# Patient Record
Sex: Female | Born: 1985 | Race: Black or African American | Hispanic: No | Marital: Single | Smoking: Former smoker
Health system: Southern US, Community
[De-identification: ages and names within clinical notes are randomized; demographics above are authoritative.]

## PROBLEM LIST (undated history)

## (undated) ENCOUNTER — Inpatient Hospital Stay (HOSPITAL_COMMUNITY): Payer: Self-pay

## (undated) DIAGNOSIS — J45909 Unspecified asthma, uncomplicated: Secondary | ICD-10-CM

## (undated) HISTORY — PX: NO PAST SURGERIES: SHX2092

## (undated) HISTORY — PX: WISDOM TOOTH EXTRACTION: SHX21

---
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
## 2008-01-07 ENCOUNTER — Other Ambulatory Visit: Admission: RE | Admit: 2008-01-07 | Discharge: 2008-01-07 | Payer: Self-pay | Admitting: Family Medicine

## 2010-04-19 ENCOUNTER — Inpatient Hospital Stay (INDEPENDENT_AMBULATORY_CARE_PROVIDER_SITE_OTHER)
Admission: RE | Admit: 2010-04-19 | Discharge: 2010-04-19 | Disposition: A | Discharge status: Home / Self Care | Payer: BC Managed Care – PPO | Source: Ambulatory Visit | Attending: Emergency Medicine | Admitting: Emergency Medicine

## 2010-04-19 DIAGNOSIS — S61409A Unspecified open wound of unspecified hand, initial encounter: Secondary | ICD-10-CM

## 2010-04-27 ENCOUNTER — Inpatient Hospital Stay (INDEPENDENT_AMBULATORY_CARE_PROVIDER_SITE_OTHER)
Admission: RE | Admit: 2010-04-27 | Discharge: 2010-04-27 | Disposition: A | Discharge status: Home / Self Care | Payer: Self-pay | Source: Ambulatory Visit | Attending: Family Medicine | Admitting: Family Medicine

## 2010-04-27 DIAGNOSIS — Z0489 Encounter for examination and observation for other specified reasons: Secondary | ICD-10-CM

## 2010-04-27 DIAGNOSIS — L732 Hidradenitis suppurativa: Secondary | ICD-10-CM

## 2010-12-22 ENCOUNTER — Emergency Department (HOSPITAL_COMMUNITY)
Admission: EM | Admit: 2010-12-22 | Discharge: 2010-12-22 | Disposition: A | Discharge status: Home / Self Care | Payer: BC Managed Care – PPO | Attending: Emergency Medicine | Admitting: Emergency Medicine

## 2010-12-22 ENCOUNTER — Encounter: Payer: Self-pay | Admitting: Emergency Medicine

## 2010-12-22 DIAGNOSIS — R112 Nausea with vomiting, unspecified: Secondary | ICD-10-CM | POA: Insufficient documentation

## 2010-12-22 LAB — COMPREHENSIVE METABOLIC PANEL
Alkaline Phosphatase: 97 U/L (ref 39–117)
BUN: 8 mg/dL (ref 6–23)
Calcium: 9.4 mg/dL (ref 8.4–10.5)
Creatinine, Ser: 0.99 mg/dL (ref 0.50–1.10)
GFR calc Af Amer: >90 mL/min (ref >90)
Glucose, Bld: 95 mg/dL (ref 70–99)
Potassium: 3.4 mEq/L — ABNORMAL LOW (ref 3.5–5.1)
Total Protein: 8.2 g/dL (ref 6.0–8.3)

## 2010-12-22 LAB — CBC
HCT: 41.8 % (ref 36.0–46.0)
Hemoglobin: 14.2 g/dL (ref 12.0–15.0)
MCH: 29.3 pg (ref 26.0–34.0)
MCV: 86.2 fL (ref 78.0–100.0)
RBC: 4.85 MIL/uL (ref 3.87–5.11)

## 2010-12-22 LAB — DIFFERENTIAL
Eosinophils Absolute: 0.1 10*3/uL (ref 0.0–0.7)
Eosinophils Relative: 1 % (ref 0–5)
Lymphs Abs: 2.3 10*3/uL (ref 0.7–4.0)
Monocytes Absolute: 0.9 10*3/uL (ref 0.1–1.0)
Monocytes Relative: 9 % (ref 3–12)

## 2010-12-22 LAB — URINALYSIS, ROUTINE W REFLEX MICROSCOPIC
Nitrite: NEGATIVE
pH: 6.5 (ref 5.0–8.0)

## 2010-12-22 LAB — URINE MICROSCOPIC-ADD ON

## 2010-12-22 MED ORDER — FAMOTIDINE IN NACL 20-0.9 MG/50ML-% IV SOLN
20.0000 mg | Freq: Once | INTRAVENOUS | Status: AC
  Administered 2010-12-22: 20 mg via INTRAVENOUS
  Filled 2010-12-22: qty 50

## 2010-12-22 MED ORDER — ONDANSETRON HCL 4 MG/2ML IJ SOLN
4.0000 mg | Freq: Once | INTRAMUSCULAR | Status: AC
  Administered 2010-12-22: 4 mg via INTRAVENOUS
  Filled 2010-12-22: qty 2

## 2010-12-22 MED ORDER — ONDANSETRON HCL 4 MG PO TABS: 4.0000 mg | ORAL_TABLET | Freq: Four times a day (QID) | ORAL | Status: AC

## 2010-12-22 MED ORDER — SODIUM CHLORIDE 0.9 % IV BOLUS (SEPSIS)
1000.0000 mL | Freq: Once | INTRAVENOUS | Status: AC
  Administered 2010-12-22: 1000 mL via INTRAVENOUS

## 2010-12-22 MED ORDER — OMEPRAZOLE 20 MG PO CPDR: 20.0000 mg | DELAYED_RELEASE_CAPSULE | Freq: Every day | ORAL | Status: DC

## 2010-12-22 NOTE — ED Notes (Signed)
Pt alert, nad, c/o nausea with emesis, inset a few days ago, resp even unlabored, skin bwd, denies changes in bowel or bladder habits, denies recent ill contacts or exposures

## 2010-12-22 NOTE — ED Notes (Signed)
Patient stable upon discharge. Patient discharged to care of friend

## 2010-12-22 NOTE — ED Provider Notes (Signed)
History     CSN: [id number] Arrival date & time: 12/22/2010  7:35 PM   First MD Initiated Contact with Patient 12/22/10 2105      Chief Complaint  Patient presents with  . Nausea  . Emesis    (Consider location/radiation/quality/duration/timing/severity/associated sxs/prior treatment) HPI Comments: Patient presents complaining of nausea for the last 3 days.  She denies any abdominal pain.  She has no fevers.  She is diarrhea.  States her last menstrual period was November 15 normal for her.  Denies any vaginal  discharge or dysuria symptoms. she can have nausea when attempting to eat or in between.  She has kept some food down but notes that within a few hours she will have a vomiting episode that is without any pain.  She has not had similar symptoms in the past.  She has no past abdominal surgeries.    Patient is a 25 y.o. female presenting with vomiting. The history is provided by the patient. No language interpreter was used.  Emesis  This is a new problem. The current episode started 2 days ago. The problem occurs 2 to 4 times per day. The problem has not changed since onset.The emesis has an appearance of stomach contents. There has been no fever. Pertinent negatives include no abdominal pain, no chills, no cough, no diarrhea, no fever and no headaches.    History reviewed. No pertinent past medical history.  History reviewed. No pertinent past surgical history.  No family history on file.  History  Substance Use Topics  . Smoking status: Passive Smoker -- 0.5 packs/day    Types: Cigarettes  . Smokeless tobacco: Not on file  . Alcohol Use: No    OB History    Grav Para Term Preterm Abortions TAB SAB Ect Mult Living                  Review of Systems  Constitutional: Negative.  Negative for fever and chills.  HENT: Negative.   Eyes: Negative.  Negative for discharge and redness.  Respiratory: Negative.  Negative for cough and shortness of breath.   Cardiovascular:  Negative.  Negative for chest pain.  Gastrointestinal: Positive for nausea and vomiting. Negative for abdominal pain and diarrhea.  Genitourinary: Negative.  Negative for dysuria and vaginal discharge.  Musculoskeletal: Negative.  Negative for back pain.  Skin: Negative.  Negative for color change and rash.  Neurological: Negative.  Negative for syncope and headaches.  Hematological: Negative.  Negative for adenopathy.  Psychiatric/Behavioral: Negative.  Negative for confusion.  All other systems reviewed and are negative.    Allergies  Benadryl allergy and Penicillins  Home Medications  No current outpatient prescriptions on file.  BP 140/80  Pulse 80  Temp(Src) 98 F (36.7 C) (Oral)  Resp 16  Wt 280 lb (127.007 kg)  LMP 12/03/2010  Physical Exam  Constitutional: She is oriented to person, place, and time. She appears well-developed and well-nourished.  Non-toxic appearance. She does not have a sickly appearance.  HENT:  Head: Normocephalic and atraumatic.  Eyes: Conjunctivae, EOM and lids are normal. Pupils are equal, round, and reactive to light. No scleral icterus.  Neck: Trachea normal and normal range of motion. Neck supple.  Cardiovascular: Normal rate, regular rhythm and normal heart sounds.   Pulmonary/Chest: Effort normal and breath sounds normal.  Abdominal: Soft. Normal appearance. There is no tenderness. There is no rebound, no guarding and no CVA tenderness.  Musculoskeletal: Normal range of motion.  Neurological: She is  alert and oriented to person, place, and time. She has normal strength.  Skin: Skin is warm, dry and intact. No rash noted.  Psychiatric: She has a normal mood and affect. Her behavior is normal. Judgment and thought content normal.    ED Course  Procedures (including critical care time)  Results for orders placed during the hospital encounter of 12/22/10  CBC      Component Value Range   WBC 10.2  4.0 - 10.5 (K/uL)   RBC 4.85  3.87 -  5.11 (MIL/uL)   Hemoglobin 14.2  12.0 - 15.0 (g/dL)   HCT 16.1  09.6 - 04.5 (%)   MCV 86.2  78.0 - 100.0 (fL)   MCH 29.3  26.0 - 34.0 (pg)   MCHC 34.0  30.0 - 36.0 (g/dL)   RDW 40.9  81.1 - 91.4 (%)   Platelets 231  150 - 400 (K/uL)  DIFFERENTIAL      Component Value Range   Neutrophils Relative 68  43 - 77 (%)   Neutro Abs 6.9  1.7 - 7.7 (K/uL)   Lymphocytes Relative 22  12 - 46 (%)   Lymphs Abs 2.3  0.7 - 4.0 (K/uL)   Monocytes Relative 9  3 - 12 (%)   Monocytes Absolute 0.9  0.1 - 1.0 (K/uL)   Eosinophils Relative 1  0 - 5 (%)   Eosinophils Absolute 0.1  0.0 - 0.7 (K/uL)   Basophils Relative 0  0 - 1 (%)   Basophils Absolute 0.0  0.0 - 0.1 (K/uL)  COMPREHENSIVE METABOLIC PANEL      Component Value Range   Sodium 136  135 - 145 (mEq/L)   Potassium 3.4 (*) 3.5 - 5.1 (mEq/L)   Chloride 99  96 - 112 (mEq/L)   CO2 30  19 - 32 (mEq/L)   Glucose, Bld 95  70 - 99 (mg/dL)   BUN 8  6 - 23 (mg/dL)   Creatinine, Ser 7.82  0.50 - 1.10 (mg/dL)   Calcium 9.4  8.4 - 95.6 (mg/dL)   Total Protein 8.2  6.0 - 8.3 (g/dL)   Albumin 3.7  3.5 - 5.2 (g/dL)   AST 13  0 - 37 (U/L)   ALT 11  0 - 35 (U/L)   Alkaline Phosphatase 97  39 - 117 (U/L)   Total Bilirubin 0.1 (*) 0.3 - 1.2 (mg/dL)   GFR calc non Af Amer 80 (*) >90 (mL/min)   GFR calc Af Amer >90  >90 (mL/min)  URINALYSIS, ROUTINE W REFLEX MICROSCOPIC      Component Value Range   Color, Urine YELLOW  YELLOW    APPearance CLOUDY (*) CLEAR    Specific Gravity, Urine 1.031 (*) 1.005 - 1.030    pH 6.5  5.0 - 8.0    Glucose, UA NEGATIVE  NEGATIVE (mg/dL)   Hgb urine dipstick NEGATIVE  NEGATIVE    Bilirubin Urine NEGATIVE  NEGATIVE    Ketones, ur TRACE (*) NEGATIVE (mg/dL)   Protein, ur NEGATIVE  NEGATIVE (mg/dL)   Urobilinogen, UA 1.0  0.0 - 1.0 (mg/dL)   Nitrite NEGATIVE  NEGATIVE    Leukocytes, UA SMALL (*) NEGATIVE   PREGNANCY, URINE      Component Value Range   Preg Test, Ur NEGATIVE    URINE MICROSCOPIC-ADD ON      Component  Value Range   Squamous Epithelial / LPF MANY (*) RARE    WBC, UA 3-6  <3 (WBC/hpf)   RBC / HPF 0-2  <  3 (RBC/hpf)   Bacteria, UA MANY (*) RARE    Urine-Other MUCOUS PRESENT         MDM  Patient presents with nausea without clear etiology.  She has normal vital signs here.  She has been given hydration as well.  Her nausea is somewhat better after the medications here.  She is no acute signs for infection on her history physical exam or on her laboratory studies.  Patient does have a primary care physician and I have advised her to followup later this week or next week especially if her symptoms persist.  She also knows to return for worsening abdominal pain, fevers, persistent vomiting or other concerns.  I've also given her a GI physician  information for followup as well.    Nat Christen, MD 12/22/10 [phone]

## 2013-12-27 ENCOUNTER — Other Ambulatory Visit (HOSPITAL_COMMUNITY): Payer: Self-pay | Admitting: *Deleted

## 2013-12-27 DIAGNOSIS — N6320 Unspecified lump in the left breast, unspecified quadrant: Secondary | ICD-10-CM

## 2014-01-14 ENCOUNTER — Encounter (HOSPITAL_COMMUNITY): Payer: Self-pay | Admitting: *Deleted

## 2014-01-15 ENCOUNTER — Ambulatory Visit
Admission: RE | Admit: 2014-01-15 | Discharge: 2014-01-15 | Disposition: A | Discharge status: Home / Self Care | Payer: No Typology Code available for payment source | Source: Ambulatory Visit | Attending: Obstetrics and Gynecology | Admitting: Obstetrics and Gynecology

## 2014-01-15 ENCOUNTER — Encounter (HOSPITAL_COMMUNITY): Payer: Self-pay

## 2014-01-15 ENCOUNTER — Ambulatory Visit (HOSPITAL_COMMUNITY)
Admission: RE | Admit: 2014-01-15 | Discharge: 2014-01-15 | Disposition: A | Discharge status: Home / Self Care | Payer: No Typology Code available for payment source | Source: Ambulatory Visit | Attending: Obstetrics and Gynecology | Admitting: Obstetrics and Gynecology

## 2014-01-15 VITALS — BP 110/68 | Temp 97.2°F | Ht 66.5 in | Wt 240.4 lb

## 2014-01-15 DIAGNOSIS — N6320 Unspecified lump in the left breast, unspecified quadrant: Secondary | ICD-10-CM

## 2014-01-15 DIAGNOSIS — Z1239 Encounter for other screening for malignant neoplasm of breast: Secondary | ICD-10-CM

## 2014-01-15 NOTE — Progress Notes (Signed)
Complaints of left breast lump x 2 months that [facility]. Lump is located at 5 o'clock 3 inches from the nipple.  Pap Smear:  Pap smear not completed today. [facility] and ACUS HPV negative. Per patient thinks she has had other abnormal Pap smears but is unsure of when. Pap smear recommended in 1 year. Last Pap smear result is in EPIC under media.  Physical exam: Breasts Breasts symmetrical. No skin abnormalities bilateral breasts. No nipple retraction bilateral breasts. No nipple discharge bilateral breasts. No lymphadenopathy. No lumps palpated right breast. Palpated a questionable moveable lump within the left outer lower breast. No complaints of pain or tenderness on exam. [facility]. Appointment scheduled for Wednesday, January 15, 2014 at 1620.       Pelvic/Bimanual No Pap smear completed today since last Pap smear was 10/29/2013. Pap smear not indicated per BCCCP guidelines.

## 2014-01-15 NOTE — Patient Instructions (Signed)
Explained to Tracy Lynn that she did not need a Pap smear today due to last Pap smear was 10/29/2013. Let her know she will need a Pap smear in 1 year due to her recent history of an abnormal Pap smear. [facility]. Appointment scheduled for Wednesday, January 15, 2014 at 1620. Patient aware of appointment and will be there. Tracy Lynn verbalized understanding.  Lizvet Chunn, Kathaleen Maserhristine Poll, RN 3:49 PM

## 2014-05-31 ENCOUNTER — Encounter (HOSPITAL_BASED_OUTPATIENT_CLINIC_OR_DEPARTMENT_OTHER): Payer: Self-pay | Admitting: *Deleted

## 2014-05-31 ENCOUNTER — Emergency Department (HOSPITAL_BASED_OUTPATIENT_CLINIC_OR_DEPARTMENT_OTHER)
Admission: EM | Admit: 2014-05-31 | Discharge: 2014-05-31 | Disposition: A | Discharge status: Home / Self Care | Payer: No Typology Code available for payment source | Attending: Emergency Medicine | Admitting: Emergency Medicine

## 2014-05-31 ENCOUNTER — Emergency Department (HOSPITAL_BASED_OUTPATIENT_CLINIC_OR_DEPARTMENT_OTHER): Payer: No Typology Code available for payment source

## 2014-05-31 DIAGNOSIS — Z87891 Personal history of nicotine dependence: Secondary | ICD-10-CM | POA: Insufficient documentation

## 2014-05-31 DIAGNOSIS — Y9389 Activity, other specified: Secondary | ICD-10-CM | POA: Insufficient documentation

## 2014-05-31 DIAGNOSIS — Y998 Other external cause status: Secondary | ICD-10-CM | POA: Insufficient documentation

## 2014-05-31 DIAGNOSIS — S91114A Laceration without foreign body of right lesser toe(s) without damage to nail, initial encounter: Secondary | ICD-10-CM | POA: Insufficient documentation

## 2014-05-31 DIAGNOSIS — W1849XA Other slipping, tripping and stumbling without falling, initial encounter: Secondary | ICD-10-CM | POA: Insufficient documentation

## 2014-05-31 DIAGNOSIS — Z880 Allergy status to penicillin: Secondary | ICD-10-CM | POA: Insufficient documentation

## 2014-05-31 DIAGNOSIS — S92511B Displaced fracture of proximal phalanx of right lesser toe(s), initial encounter for open fracture: Secondary | ICD-10-CM | POA: Insufficient documentation

## 2014-05-31 DIAGNOSIS — Y9289 Other specified places as the place of occurrence of the external cause: Secondary | ICD-10-CM | POA: Insufficient documentation

## 2014-05-31 DIAGNOSIS — S92911B Unspecified fracture of right toe(s), initial encounter for open fracture: Secondary | ICD-10-CM

## 2014-05-31 MED ORDER — LIDOCAINE-EPINEPHRINE 1 %-1:100000 IJ SOLN
10.0000 mL | Freq: Once | INTRAMUSCULAR | Status: DC
  Filled 2014-05-31: qty 1

## 2014-05-31 MED ORDER — HYDROCODONE-ACETAMINOPHEN 5-325 MG PO TABS: 2.0000 | ORAL_TABLET | ORAL | Status: DC | PRN

## 2014-05-31 MED ORDER — CLINDAMYCIN HCL 150 MG PO CAPS
150.0000 mg | ORAL_CAPSULE | Freq: Once | ORAL | Status: AC
  Administered 2014-05-31: 150 mg via ORAL
  Filled 2014-05-31: qty 1

## 2014-05-31 MED ORDER — CLINDAMYCIN HCL 150 MG PO CAPS: 150.0000 mg | ORAL_CAPSULE | Freq: Two times a day (BID) | ORAL | Status: DC

## 2014-05-31 NOTE — ED Notes (Signed)
Pt c/o laceration to right foot x 3 hrs ago

## 2014-05-31 NOTE — ED Provider Notes (Signed)
CSN: [id number]     Arrival date & time 05/31/14  0045 History   First MD Initiated Contact with Patient 05/31/14 0113     Chief Complaint  Patient presents with  . Extremity Laceration     HPI   patient presents for evaluation after a foot injury. She tripped down some stairs and feels like she may have "stubbed" her right foot. Has some pain afterwards and noted there was some blood coming from underneath her right fourth toe presents here.   History reviewed. No pertinent past medical history. History reviewed. No pertinent past surgical history. Family History  Problem Relation Age of Onset  . Cancer Maternal Grandmother     colon  . Hypertension Paternal Grandmother   . Diabetes Paternal Grandmother    History  Substance Use Topics  . Smoking status: Former Smoker -- 0.50 packs/day    Types: Cigarettes    Quit date: 06/17/2013  . Smokeless tobacco: Never Used  . Alcohol Use: Yes     Comment: socially   OB History    Gravida Para Term Preterm AB TAB SAB Ectopic Multiple Living   1    1  1    0     Review of Systems  Musculoskeletal:       Pain in the foot and area in the right fourth and fifth digit with a laceration under the right fourth toe  Skin: Positive for wound.      Allergies  Diphenhydramine hcl and Penicillins  Home Medications   Prior to Admission medications   Medication Sig Start Date End Date Taking? Authorizing Provider  clindamycin (CLEOCIN) 150 MG capsule Take 1 capsule (150 mg total) by mouth 2 (two) times daily. 05/31/14   Rolland PorterMark Itati Brocksmith, MD  HYDROcodone-acetaminophen (NORCO/VICODIN) 5-325 MG per tablet Take 2 tablets by mouth every 4 (four) hours as needed. 05/31/14   Rolland PorterMark Elly Haffey, MD  omeprazole (PRILOSEC) 20 MG capsule Take 1 capsule (20 mg total) by mouth daily. 12/22/10 12/22/11  Emeline GeneralKathleen Hosmer, MD   BP 152/84 mmHg  Pulse 90  Temp(Src) 98.8 F (37.1 C)  Resp 16  SpO2 98%  LMP 05/31/2014 Physical Exam  Musculoskeletal:        Feet:  Laceration noted at the fourth digit plantar surface at the proximal skin fold. Flexion of the fourth and fifth digits is intact. Wound is nearly self approximating.    ED Course  Procedures (including critical care time) Labs Review Labs Reviewed - No data to display  Imaging Review Dg Toe 4th Right  05/31/2014   CLINICAL DATA:  Patient fell while chasing dog tonight. Pain and laceration to the right fourth toe.  EXAM: RIGHT FOURTH TOE  COMPARISON:  None.  FINDINGS: Transverse fractures are demonstrated in the midshaft of the proximal phalanx right fourth toe and of the proximal shaft of the proximal phalanx right fifth toe. No articular involvement is identified. Soft tissue swelling is present. No radiopaque soft tissue foreign bodies.  IMPRESSION: Transverse fractures of the proximal phalanges of the right fourth and fifth toes.   Electronically Signed   By: Burman NievesWilliam  Stevens M.D.   On: 05/31/2014 01:55     EKG Interpretation None      MDM   Final diagnoses:  Open toe fracture, right, initial encounter    Laceration was irrigated. Dressing applied. Placed in a cam walker. Given sports medicine for follow-up. Clindamycin for wound prophylaxis.    Rolland PorterMark Tanice Petre, MD 05/31/14 [phone]

## 2014-05-31 NOTE — Discharge Instructions (Signed)
Clean the area of the laceration daily, apply antibiotic ointment, and a dressing. Walk/ bear weight only with the Cam Walker.  Call Dr. Pearletha ForgeHudnall for a follow-up appointment.  Toe Fracture Your caregiver has diagnosed you as having a fractured toe. A toe fracture is a break in the bone of a toe. "Buddy taping" is a way of splinting your broken toe, by taping the broken toe to the toe next to it. This "buddy taping" will keep the injured toe from moving beyond normal range of motion. Buddy taping also helps the toe heal in a more normal alignment. It may take 6 to 8 weeks for the toe injury to heal. HOME CARE INSTRUCTIONS   Leave your toes taped together for as long as directed by your caregiver or until you see a doctor for a follow-up examination. You can change the tape after bathing. Always use a small piece of gauze or cotton between the toes when taping them together. This will help the skin stay dry and prevent infection.  Apply ice to the injury for 15-20 minutes each hour while awake for the first 2 days. Put the ice in a plastic bag and place a towel between the bag of ice and your skin.  After the first 2 days, apply heat to the injured area. Use heat for the next 2 to 3 days. Place a heating pad on the foot or soak the foot in warm water as directed by your caregiver.  Keep your foot elevated as much as possible to lessen swelling.  Wear sturdy, supportive shoes. The shoes should not pinch the toes or fit tightly against the toes.  Your caregiver may prescribe a rigid shoe if your foot is very swollen.  Your may be given crutches if the pain is too great and it hurts too much to walk.  Only take over-the-counter or prescription medicines for pain, discomfort, or fever as directed by your caregiver.  If your caregiver has given you a follow-up appointment, it is very important to keep that appointment. Not keeping the appointment could result in a chronic or permanent injury, pain,  and disability. If there is any problem keeping the appointment, you must call back to this facility for assistance. SEEK MEDICAL CARE IF:   You have increased pain or swelling, not relieved with medications.  The pain does not get better after 1 week.  Your injured toe is cold when the others are warm. SEEK IMMEDIATE MEDICAL CARE IF:   The toe becomes cold, numb, or white.  The toe becomes hot (inflamed) and red. Document Released: 01/01/2000 Document Revised: 03/28/2011 Document Reviewed: 08/20/2007 [facility] Patient Information 2015 [employer] [place]. This information is not intended to replace advice given to you by your health care provider. Make sure you discuss any questions you have with your health care provider.

## 2014-06-04 ENCOUNTER — Ambulatory Visit (INDEPENDENT_AMBULATORY_CARE_PROVIDER_SITE_OTHER): Payer: Self-pay | Admitting: Family Medicine

## 2014-06-04 ENCOUNTER — Encounter: Payer: Self-pay | Admitting: Family Medicine

## 2014-06-04 VITALS — BP 128/84 | HR 86 | Ht 66.0 in | Wt 220.0 lb

## 2014-06-04 DIAGNOSIS — S99921A Unspecified injury of right foot, initial encounter: Secondary | ICD-10-CM

## 2014-06-04 MED ORDER — HYDROCODONE-ACETAMINOPHEN 5-325 MG PO TABS: 1.0000 | ORAL_TABLET | Freq: Four times a day (QID) | ORAL | Status: DC | PRN

## 2014-06-04 NOTE — Patient Instructions (Signed)
Continue with buddy taping of the outside 3 toes. Neosporin to the cut that you have. It's very important you fill the antibiotic the emergency department gave you and take this as directed. You can take aleve 2 tabs twice a day, ibuprofen 600mg  three times a day with food for pain and inflammation. Norco as needed for severe pain - no driving on this medicine. Elevate above the level of your heart as needed for swelling. Follow up with me in 1 week for reevaluation.

## 2014-06-06 DIAGNOSIS — S99921A Unspecified injury of right foot, initial encounter: Secondary | ICD-10-CM | POA: Insufficient documentation

## 2014-06-06 NOTE — Assessment & Plan Note (Signed)
Right 4th, 5th proximal phalanx fractures - should heal well with conservative treatment.  No malrotation or angulation.  Stressed importance of taking the antibiotics the ED prescribed because of infection risk with open fracture.  Neosporin, buddy taping, cam walker.  NSAIDs with norco as needed.  F/u in 1 week.

## 2014-06-06 NOTE — Progress Notes (Signed)
PCP: No primary care provider on file.  Subjective:   HPI: Patient is a 29 y.o. female here for right toe injury.  Patient reports she was running down some steps on 5/14 when she fell down and believes she may have bent back or stubbed 4th and 5th toes. Radiographs showed fractures of proximal phalanx of both digits. Laceration plantar aspect base of 5th toe - was irrigated and dressed by ED Has been buddy taping 3rd-5th digits. No prior injuries. Did not fill the clindamycin or pain medicine from ED. No fevers, purulence from laceration.  No past medical history on file.  Current Outpatient Prescriptions on File Prior to Visit  Medication Sig Dispense Refill  . clindamycin (CLEOCIN) 150 MG capsule Take 1 capsule (150 mg total) by mouth 2 (two) times daily. 14 capsule 0  . omeprazole (PRILOSEC) 20 MG capsule Take 1 capsule (20 mg total) by mouth daily. 30 capsule 0   No current facility-administered medications on file prior to visit.    No past surgical history on file.  Allergies  Allergen Reactions  . Diphenhydramine Hcl   . Penicillins     History   Social History  . Marital Status: Single    Spouse Name: N/A  . Number of Children: N/A  . Years of Education: N/A   Occupational History  . Not on file.   Social History Main Topics  . Smoking status: Former Smoker -- 0.50 packs/day    Types: Cigarettes    Quit date: 06/17/2013  . Smokeless tobacco: Never Used  . Alcohol Use: 0.0 oz/week    0 Standard drinks or equivalent per week     Comment: socially  . Drug Use: 7.00 per week    Special: Marijuana  . Sexual Activity: Not Currently   Other Topics Concern  . Not on file   Social History Narrative    Family History  Problem Relation Age of Onset  . Cancer Maternal Grandmother     colon  . Hypertension Paternal Grandmother   . Diabetes Paternal Grandmother     BP 128/84 mmHg  Pulse 86  Ht 5\' 6"  (1.676 m)  Wt 220 lb (99.791 kg)  BMI 35.53 kg/m2   LMP 05/31/2014  Review of Systems: See HPI above.    Objective:  Physical Exam:  Gen: NAD  Right foot: Laceration base plantar 5th digit - well approximated though does open - no tendon visible.  No erythema, purulence.  No angulation or malrotation of digits.  Mild swelling 4th, 5th digits.   TTP 4th and 5th digits, some distal 4th 5th metatarsals.    Assessment & Plan:  1. Right 4th, 5th proximal phalanx fractures - should heal well with conservative treatment.  No malrotation or angulation.  Stressed importance of taking the antibiotics the ED prescribed because of infection risk with open fracture.  Neosporin, buddy taping, cam walker.  NSAIDs with norco as needed.  F/u in 1 week.

## 2014-06-18 ENCOUNTER — Ambulatory Visit: Payer: Self-pay | Admitting: Family Medicine

## 2014-06-19 ENCOUNTER — Encounter: Payer: Self-pay | Admitting: Family Medicine

## 2014-06-19 ENCOUNTER — Encounter (INDEPENDENT_AMBULATORY_CARE_PROVIDER_SITE_OTHER): Payer: Self-pay

## 2014-06-19 ENCOUNTER — Ambulatory Visit (INDEPENDENT_AMBULATORY_CARE_PROVIDER_SITE_OTHER): Payer: Self-pay | Admitting: Family Medicine

## 2014-06-19 VITALS — BP 135/85 | HR 75 | Ht 66.0 in | Wt 225.0 lb

## 2014-06-19 DIAGNOSIS — S99921D Unspecified injury of right foot, subsequent encounter: Secondary | ICD-10-CM

## 2014-06-23 NOTE — Assessment & Plan Note (Signed)
Right 4th, 5th proximal phalanx fractures - much improved clinically and laceration healing as well without signs of infection.  Continue buddy taping, cam walker until at least 4 weeks out then can transition out of these if tolerated to a well supported shoe.  F/u in 4 weeks or as needed.

## 2014-06-23 NOTE — Progress Notes (Signed)
PCP: No primary care provider on file.  Subjective:   HPI: Patient is a 29 y.o. female here for right toe injury.  5/18: Patient reports she was running down some steps on 5/14 when she fell down and believes she may have bent back or stubbed 4th and 5th toes. Radiographs showed fractures of proximal phalanx of both digits. Laceration plantar aspect base of 5th toe - was irrigated and dressed by ED Has been buddy taping 3rd-5th digits. No prior injuries. Did not fill the clindamycin or pain medicine from ED. No fevers, purulence from laceration.  6/2: Patient reports she is feeling a lot better - pain level 0/10 currently. Wearing boot still at work. Has been able to put some weight on this without boot on. Finished her antibiotics.  No past medical history on file.  Current Outpatient Prescriptions on File Prior to Visit  Medication Sig Dispense Refill  . clindamycin (CLEOCIN) 150 MG capsule Take 1 capsule (150 mg total) by mouth 2 (two) times daily. 14 capsule 0  . HYDROcodone-acetaminophen (NORCO/VICODIN) 5-325 MG per tablet Take 1 tablet by mouth every 6 (six) hours as needed. 40 tablet 0  . omeprazole (PRILOSEC) 20 MG capsule Take 1 capsule (20 mg total) by mouth daily. 30 capsule 0   No current facility-administered medications on file prior to visit.    No past surgical history on file.  Allergies  Allergen Reactions  . Diphenhydramine Hcl   . Penicillins     History   Social History  . Marital Status: Single    Spouse Name: N/A  . Number of Children: N/A  . Years of Education: N/A   Occupational History  . Not on file.   Social History Main Topics  . Smoking status: Former Smoker -- 0.50 packs/day    Types: Cigarettes    Quit date: 06/17/2013  . Smokeless tobacco: Never Used  . Alcohol Use: 0.0 oz/week    0 Standard drinks or equivalent per week     Comment: socially  . Drug Use: 7.00 per week    Special: Marijuana  . Sexual Activity: Not  Currently   Other Topics Concern  . Not on file   Social History Narrative    Family History  Problem Relation Age of Onset  . Cancer Maternal Grandmother     colon  . Hypertension Paternal Grandmother   . Diabetes Paternal Grandmother     BP 135/85 mmHg  Pulse 75  Ht 5\' 6"  (1.676 m)  Wt 225 lb (102.059 kg)  BMI 36.33 kg/m2  LMP 05/31/2014  Review of Systems: See HPI above.    Objective:  Physical Exam:  Gen: NAD  Right foot: Laceration healing well at base plantar 5th digit - no longer opens on extension of 5th digit.  No erythema, purulence.  No angulation or malrotation of digits.  Mild swelling 4th > 5th digits.   No TTP 4th and 5th digits now.    Assessment & Plan:  1. Right 4th, 5th proximal phalanx fractures - much improved clinically and laceration healing as well without signs of infection.  Continue buddy taping, cam walker until at least 4 weeks out then can transition out of these if tolerated to a well supported shoe.  F/u in 4 weeks or as needed.

## 2015-01-13 ENCOUNTER — Encounter (HOSPITAL_COMMUNITY): Payer: Self-pay | Admitting: *Deleted

## 2015-01-13 ENCOUNTER — Inpatient Hospital Stay (HOSPITAL_COMMUNITY)
Admission: AD | Admit: 2015-01-13 | Discharge: 2015-01-13 | Disposition: A | Discharge status: Home / Self Care | Payer: Medicaid Other | Source: Ambulatory Visit | Attending: Family Medicine | Admitting: Family Medicine

## 2015-01-13 DIAGNOSIS — A499 Bacterial infection, unspecified: Secondary | ICD-10-CM

## 2015-01-13 DIAGNOSIS — O23591 Infection of other part of genital tract in pregnancy, first trimester: Secondary | ICD-10-CM | POA: Insufficient documentation

## 2015-01-13 DIAGNOSIS — Z3A01 Less than 8 weeks gestation of pregnancy: Secondary | ICD-10-CM | POA: Diagnosis not present

## 2015-01-13 DIAGNOSIS — N760 Acute vaginitis: Secondary | ICD-10-CM | POA: Insufficient documentation

## 2015-01-13 DIAGNOSIS — Z87891 Personal history of nicotine dependence: Secondary | ICD-10-CM | POA: Insufficient documentation

## 2015-01-13 DIAGNOSIS — O26891 Other specified pregnancy related conditions, first trimester: Secondary | ICD-10-CM | POA: Diagnosis present

## 2015-01-13 DIAGNOSIS — B9689 Other specified bacterial agents as the cause of diseases classified elsewhere: Secondary | ICD-10-CM

## 2015-01-13 DIAGNOSIS — Z880 Allergy status to penicillin: Secondary | ICD-10-CM | POA: Diagnosis not present

## 2015-01-13 HISTORY — DX: Unspecified asthma, uncomplicated: J45.909

## 2015-01-13 LAB — POCT PREGNANCY, URINE: PREG TEST UR: POSITIVE — AB

## 2015-01-13 LAB — WET PREP, GENITAL
SPERM: NONE SEEN
Trich, Wet Prep: NONE SEEN
YEAST WET PREP: NONE SEEN

## 2015-01-13 MED ORDER — METRONIDAZOLE 500 MG PO TABS: 500.0000 mg | ORAL_TABLET | Freq: Two times a day (BID) | ORAL | Status: DC

## 2015-01-13 NOTE — MAU Provider Note (Signed)
  History     CSN: [id number]  Arrival date and time: 01/13/15 1241   None     No chief complaint on file.  HPI Tracy Lynn is a 29yo G2P0010 @ 7.3wks by LMP who presents requesting to find out her exact due date. She also requests STD testing. Denies pain or bldg.   OB History    Gravida Para Term Preterm AB TAB SAB Ectopic Multiple Living   2    1  1    0      Past Medical History  Diagnosis Date  . Asthma     History reviewed. No pertinent past surgical history.  Family History  Problem Relation Age of Onset  . Cancer Maternal Grandmother     colon  . Hypertension Paternal Grandmother   . Diabetes Paternal Grandmother     Social History  Substance Use Topics  . Smoking status: Former Smoker -- 0.50 packs/day    Types: Cigarettes    Quit date: 06/17/2013  . Smokeless tobacco: Never Used  . Alcohol Use: 0.0 oz/week    0 Standard drinks or equivalent per week     Comment: socially    Allergies:  Allergies  Allergen Reactions  . Diphenhydramine Hcl   . Penicillins     Prescriptions prior to admission  Medication Sig Dispense Refill Last Dose  . clindamycin (CLEOCIN) 150 MG capsule Take 1 capsule (150 mg total) by mouth 2 (two) times daily. 14 capsule 0   . HYDROcodone-acetaminophen (NORCO/VICODIN) 5-325 MG per tablet Take 1 tablet by mouth every 6 (six) hours as needed. 40 tablet 0   . omeprazole (PRILOSEC) 20 MG capsule Take 1 capsule (20 mg total) by mouth daily. 30 capsule 0     ROS Physical Exam   Blood pressure 135/83, pulse 80, temperature 98.4 F (36.9 C), temperature source Oral, resp. rate 18, height 5\' 8"  (1.727 m), weight 103.602 kg (228 lb 6.4 oz), last menstrual period 11/22/2014.  Physical Exam  Constitutional: She is oriented to person, place, and time. She appears well-developed.  HENT:  Head: Normocephalic.  Neck: Normal range of motion.  Cardiovascular: Normal rate.   Respiratory: Effort normal.  GI: Soft.  Genitourinary: Vagina  normal.  SE: thin white vag d/c; cx C/L/NT; uterus enlarged, but difficult to tell size due to habitus  Musculoskeletal: Normal range of motion.  Neurological: She is alert and oriented to person, place, and time.  Skin: Skin is warm and dry.  Psychiatric: She has a normal mood and affect. Her behavior is normal. Thought content normal.   Wet prep: Present clue cells, mod WBC, bacteria TNTC  UPT: pos   MAU Course  Procedures  MDM UPT Wet prep GC/chlam  Assessment and Plan  Early preg, 7.3wks by LMP BV  D/C home  Rx Flagyl 500 BID x 7d to pharmacy [facility] for f/u as pt is concerned about her gestation due to possible desire to terminate [facility] for prenatal care if she desires to continue with the pregnancy  Cam HaiSHAW, Volney Reierson CNM 01/13/2015, 3:20 PM   Return to MAU just after leaving w/ strong request for U/S. Bedside U/S by Ivonne AndrewV Smith CNM shows approx 6561w5d sac w/ flicker of heartbeat. Pt now to be discharged w/ instructions to seek prenatal care. Will also have formal dating U/S outpatient.  Cam HaiSHAW, Wilma Wuthrich 01/13/2015 5:35 PM

## 2015-01-13 NOTE — MAU Note (Signed)
Urine sent to lab 

## 2015-01-13 NOTE — Discharge Instructions (Signed)

## 2015-01-13 NOTE — MAU Note (Signed)
Neg preg test on 12/12, period never came on.  3 +HPT over weekend.

## 2015-01-13 NOTE — Progress Notes (Signed)
Kim Shaw CNM in earlier to discuss test results and d/c plan. Written and verbal d/c instructions given and understanding voiced 

## 2015-01-17 LAB — GC/CHLAMYDIA PROBE AMP (~~LOC~~) NOT AT ARMC
Chlamydia: NEGATIVE
Neisseria Gonorrhea: NEGATIVE

## 2015-01-21 ENCOUNTER — Other Ambulatory Visit: Payer: Self-pay | Admitting: Advanced Practice Midwife

## 2015-01-21 DIAGNOSIS — O3680X0 Pregnancy with inconclusive fetal viability, not applicable or unspecified: Secondary | ICD-10-CM

## 2015-02-13 ENCOUNTER — Inpatient Hospital Stay (HOSPITAL_COMMUNITY)
Admission: AD | Admit: 2015-02-13 | Discharge: 2015-02-13 | Disposition: A | Discharge status: Home / Self Care | Payer: Medicaid Other | Source: Ambulatory Visit | Attending: Family Medicine | Admitting: Family Medicine

## 2015-02-13 DIAGNOSIS — Z3A11 11 weeks gestation of pregnancy: Secondary | ICD-10-CM | POA: Insufficient documentation

## 2015-02-13 DIAGNOSIS — O0930 Supervision of pregnancy with insufficient antenatal care, unspecified trimester: Secondary | ICD-10-CM

## 2015-02-13 DIAGNOSIS — O26891 Other specified pregnancy related conditions, first trimester: Secondary | ICD-10-CM | POA: Insufficient documentation

## 2015-02-13 DIAGNOSIS — J069 Acute upper respiratory infection, unspecified: Secondary | ICD-10-CM | POA: Diagnosis not present

## 2015-02-13 DIAGNOSIS — O0931 Supervision of pregnancy with insufficient antenatal care, first trimester: Secondary | ICD-10-CM | POA: Diagnosis not present

## 2015-02-13 DIAGNOSIS — O9989 Other specified diseases and conditions complicating pregnancy, childbirth and the puerperium: Secondary | ICD-10-CM

## 2015-02-13 DIAGNOSIS — R05 Cough: Secondary | ICD-10-CM | POA: Diagnosis present

## 2015-02-13 DIAGNOSIS — Z87891 Personal history of nicotine dependence: Secondary | ICD-10-CM | POA: Insufficient documentation

## 2015-02-13 DIAGNOSIS — J45909 Unspecified asthma, uncomplicated: Secondary | ICD-10-CM | POA: Diagnosis not present

## 2015-02-13 NOTE — MAU Note (Signed)
Symptoms of sinus infection for a week. When cough have some yellow sputum. Clear now when blow nose. Decreased appetite.

## 2015-02-13 NOTE — MAU Provider Note (Signed)
Chief Complaint: URI   None     SUBJECTIVE HPI: Tracy Lynn is a 30 y.o. G2P0010 at [redacted]w[redacted]d by LMP who presents to maternity admissions reporting congestion, cough, sinus pressure x 1 week. She denies fever, chills. She has been going to work but it has been difficult.  She is not sure what medicines are safe to take in pregnancy.  She has tried warm shower/bath to clear her sinuses, drinking more fluids, and trying to rest more.  These are helping but do not resolve her symptoms. She has not tried any medication.  She is interested in midwives and waterbirth and is applying for pregnancy Medicaid currently.   She denies abdominal pain, vaginal bleeding, vaginal itching/burning, urinary symptoms, h/a, dizziness, n/v, or fever/chills.     HPI  Past Medical History  Diagnosis Date  . Asthma    No past surgical history on file. Social History   Social History  . Marital Status: Single    Spouse Name: N/A  . Number of Children: N/A  . Years of Education: N/A   Occupational History  . Not on file.   Social History Main Topics  . Smoking status: Former Smoker -- 0.50 packs/day    Types: Cigarettes    Quit date: 06/17/2013  . Smokeless tobacco: Never Used  . Alcohol Use: 0.0 oz/week    0 Standard drinks or equivalent per week     Comment: socially  . Drug Use: 7.00 per week    Special: Marijuana     Comment: last smoked last Thursday but none since finding out pregnant  . Sexual Activity: Yes   Other Topics Concern  . Not on file   Social History Narrative   No current facility-administered medications on file prior to encounter.   Current Outpatient Prescriptions on File Prior to Encounter  Medication Sig Dispense Refill  . metroNIDAZOLE (FLAGYL) 500 MG tablet Take 1 tablet (500 mg total) by mouth 2 (two) times daily. 14 tablet 0   Allergies  Allergen Reactions  . Diphenhydramine Hcl   . Penicillins     ROS:  Review of Systems  Constitutional: Negative for fever,  chills and fatigue.  HENT: Positive for congestion, postnasal drip and sinus pressure.   Respiratory: Positive for cough. Negative for shortness of breath.   Cardiovascular: Negative for chest pain.  Gastrointestinal: Negative for abdominal pain.  Genitourinary: Negative for dysuria, flank pain, vaginal bleeding, vaginal discharge, difficulty urinating, vaginal pain and pelvic pain.  Neurological: Negative for dizziness and headaches.  Psychiatric/Behavioral: Negative.      I have reviewed patient's Past Medical Hx, Surgical Hx, Family Hx, Social Hx, medications and allergies.   Physical Exam   Patient Vitals for the past 24 hrs:  BP Temp Pulse Resp Height Weight  02/13/15 2229 - 97.6 F (36.4 C) - - - -  02/13/15 2228 127/78 mmHg - 97 18 5' 6.5" (1.689 m) 221 lb 12.8 oz (100.608 kg)   Constitutional: Well-developed, well-nourished female in no acute distress.  HEART: normal rate, heart sounds, regular rhythm RESP: normal effort, lung sounds clear and equal bilaterally GI: Abd soft, non-tender. Pos BS x 4 MS: Extremities nontender, no edema, normal ROM Neurologic: Alert and oriented x 4.  GU: Neg CVAT.   FHT 172 by doppler   MAU Management/MDM: Medical screen performed in triage by CNM.  Pt stable, no fever or chills, no shortness of breath. Likely viral URI.  Discussed safe OTC meds with pt and given list of  safe medicines in pregnancy.  Recommend continue increased PO fluids and rest.  Return to MAU or follow up with primary care if symptoms persist or worsen, especially if fever develops. Pt stable at time of discharge.  ASSESSMENT 1. Acute upper respiratory infection   2. Late prenatal care affecting pregnancy     PLAN Discharge home List of prenatal providers and safe meds in pregnancy given Answered pt questions about midwives, waterbirth, [facility] Return to MAU as needed for emergencies   Medication List    STOP taking these medications         metroNIDAZOLE 500 MG tablet  Commonly known as:  Bunnie Pion Certified Nurse-Midwife 02/14/2015  12:05 AM

## 2015-02-13 NOTE — MAU Note (Addendum)
Sharen Counter CNM in Triage to see pt. Provider discussed plan of care and d/c plan with pt. List of medications that are ok in pregnancy given to pt. Pt d/c home from Triage

## 2015-03-20 ENCOUNTER — Encounter: Payer: Self-pay | Admitting: *Deleted

## 2015-03-20 ENCOUNTER — Ambulatory Visit (INDEPENDENT_AMBULATORY_CARE_PROVIDER_SITE_OTHER): Payer: Medicaid Other | Admitting: Advanced Practice Midwife

## 2015-03-20 ENCOUNTER — Encounter: Payer: Self-pay | Admitting: Advanced Practice Midwife

## 2015-03-20 ENCOUNTER — Other Ambulatory Visit: Payer: Self-pay | Admitting: Advanced Practice Midwife

## 2015-03-20 ENCOUNTER — Other Ambulatory Visit (HOSPITAL_COMMUNITY)
Admission: RE | Admit: 2015-03-20 | Discharge: 2015-03-20 | Disposition: A | Discharge status: Home / Self Care | Payer: Medicaid Other | Source: Ambulatory Visit | Attending: Advanced Practice Midwife | Admitting: Advanced Practice Midwife

## 2015-03-20 VITALS — BP 114/75 | HR 87 | Wt 230.0 lb

## 2015-03-20 DIAGNOSIS — O0930 Supervision of pregnancy with insufficient antenatal care, unspecified trimester: Secondary | ICD-10-CM

## 2015-03-20 DIAGNOSIS — Z3402 Encounter for supervision of normal first pregnancy, second trimester: Secondary | ICD-10-CM | POA: Diagnosis not present

## 2015-03-20 DIAGNOSIS — Z36 Encounter for antenatal screening of mother: Secondary | ICD-10-CM

## 2015-03-20 DIAGNOSIS — Z01419 Encounter for gynecological examination (general) (routine) without abnormal findings: Secondary | ICD-10-CM | POA: Insufficient documentation

## 2015-03-20 DIAGNOSIS — Z3400 Encounter for supervision of normal first pregnancy, unspecified trimester: Secondary | ICD-10-CM | POA: Insufficient documentation

## 2015-03-20 DIAGNOSIS — O0932 Supervision of pregnancy with insufficient antenatal care, second trimester: Secondary | ICD-10-CM

## 2015-03-20 LAB — HIV ANTIBODY (ROUTINE TESTING W REFLEX): HIV 1&2 Ab, 4th Generation: NONREACTIVE

## 2015-03-20 NOTE — Progress Notes (Signed)
   Subjective:    Tracy Lynn is a G2P0010 2665w6d being seen today for her first obstetrical visit.  Her obstetrical history is significant for previous SAB. Patient does intend to breast feed. Pregnancy history fully reviewed.  Patient reports vaginal discharge/irritation.  Filed Vitals:   03/20/15 0901  BP: 114/75  Pulse: 87  Weight: 230 lb (104.327 kg)    HISTORY: OB History  Gravida Para Term Preterm AB SAB TAB Ectopic Multiple Living  2    1 1     0    # Outcome Date GA Lbr Len/2nd Weight Sex Delivery Anes PTL Lv  2 Current           1 SAB              Past Medical History  Diagnosis Date  . Asthma    History reviewed. No pertinent past surgical history. Family History  Problem Relation Age of Onset  . Cancer Maternal Grandmother     colon  . Hypertension Paternal Grandmother   . Diabetes Paternal Grandmother      Exam    Uterus:     Pelvic Exam:    Perineum: No Hemorrhoids, Normal Perineum   Vulva: normal   Vagina:  normal mucosa, large amount thin watery discharge   pH:    Cervix: no bleeding following Pap, no cervical motion tenderness and no lesions   Adnexa: normal adnexa and no mass, fullness, tenderness   Bony Pelvis: average  System: Breast:  normal appearance, no masses or tenderness   Skin: normal coloration and turgor, no rashes    Neurologic: oriented, normal, normal mood, gait normal; reflexes normal and symmetric   Extremities: normal strength, tone, and muscle mass, ROM of all joints is normal   HEENT sclera clear, anicteric, neck supple with midline trachea, thyroid without masses and trachea midline   Mouth/Teeth mucous membranes moist, pharynx normal without lesions and dental hygiene good   Neck supple and no masses   Cardiovascular: regular rate and rhythm   Respiratory:  appears well, vitals normal, no respiratory distress, acyanotic, normal RR, ear and throat exam is normal, neck free of mass or lymphadenopathy   Abdomen: soft,  non-tender; bowel sounds normal; no masses,  no organomegaly   Urinary: urethral meatus normal      Assessment:    Pregnancy: G2P0010 Patient Active Problem List   Diagnosis Date Noted  . Supervision of normal first pregnancy 03/20/2015  . Injury of toe on right foot 06/06/2014   1. Late prenatal care, second trimester  - CULTURE, URINE COMPREHENSIVE - GC/chlamydia probe amp, urine - Sickle Cell Scr - Cystic fibrosis diagnostic study - Prenatal (OB Panel) - HIV antibody (with reflex) - AFP, Quad Screen - WET PREP FOR TRICH, YEAST, CLUE - Cytology - PAP - US MFM OB COMP + 14 WK; Future        Plan:        Initial labs drawn. Prenatal vitamins. Problem list reviewed and updated. Information given on Waterbirth.  Pt reports lots of stress, may move to [place] during pregnancy but undecided. Genetic Screening discussed Quad Screen: ordered.  Ultrasound discussed; fetal survey: ordered.  Follow up in 4 weeks.    LEFTWICH-KIRBY, LISA 03/20/2015

## 2015-03-20 NOTE — Patient Instructions (Signed)
Thinking About Doren Custard???  You must attend a Doren Custard class at [facility]  3rd Wednesday of every month from 7-9pm  Free  [employer] by calling [phone] or online at VFederal.at  Bring [place] the certificate from the class  Waterbirth supplies needed for [facility]/Health Department patients:  Our practice has a Heritage manager in a Box tub at the hospital that you can borrow  You will need to purchase an accessory kit that has all needed supplies through [facility] [phone]) or online $175.00  Or you can purchase the supplies separately: o Single-use disposable tub liner for Birth Pool in a Box (REGULAR size) o New garden hose labeled "lead-free", "suitable for drinking water", o Electric drain pump to remove water (We recommend 792 gallon per hour or greater pump.)  o  "non-toxic" OR "water potable" o Garden hose to remove the dirty water o Fish net o Bathing suit top (optional) o Long-handled mirror (optional)  [employer] sells tubs for ~ $120 if you would rather purchase your own tub.  They also sell accessories, liners.    Www.waterbirthsolutions.com for tub purchases and supplies  [employer] (www.thelaborladies.com) $275 for tub rental/set-up & take down/kit   [employer] information regarding doulas (labor support) who provide pool rentals:  IdentityList.se.htm   [employer] (www.thelaborladies.com)  IdentityList.se.htm   Things that would prevent you from having a waterbirth:  Premature, <37wks  Previous cesarean birth  Presence of thick meconium-stained fluid  Multiple gestation (Twins, triplets, etc.)  Uncontrolled diabetes or gestational diabetes requiring medication  Hypertension  Heavy vaginal bleeding  Non-reassuring fetal heart rate  Active infection (MRSA, etc.)  If your labor has to be induced and induction  method requires continuous monitoring of the baby's heart rate  Other risks/issues identified by your obstetrical provider

## 2015-03-20 NOTE — Progress Notes (Signed)
Thinks she might have BV.

## 2015-03-21 LAB — GC/CHLAMYDIA PROBE AMP
CT Probe RNA: NOT DETECTED
GC Probe RNA: NOT DETECTED

## 2015-03-21 LAB — SICKLE CELL SCREEN: Sickle Cell Screen: NEGATIVE

## 2015-03-22 LAB — WET PREP FOR TRICH, YEAST, CLUE
Trich, Wet Prep: NONE SEEN
Yeast Wet Prep HPF POC: NONE SEEN

## 2015-03-23 LAB — OBSTETRIC PANEL
Antibody Screen: NEGATIVE
BASOS PCT: 0 % (ref 0–1)
Basophils Absolute: 0 10*3/uL (ref 0.0–0.1)
Eosinophils Absolute: 0.3 10*3/uL (ref 0.0–0.7)
Eosinophils Relative: 3 % (ref 0–5)
HEMATOCRIT: 37.3 % (ref 36.0–46.0)
HEP B S AG: NEGATIVE
Hemoglobin: 12.8 g/dL (ref 12.0–15.0)
Lymphocytes Relative: 25 % (ref 12–46)
Lymphs Abs: 2.7 10*3/uL (ref 0.7–4.0)
MCH: 30.2 pg (ref 26.0–34.0)
MCHC: 34.3 g/dL (ref 30.0–36.0)
MCV: 88 fL (ref 78.0–100.0)
MONOS PCT: 9 % (ref 3–12)
MPV: 9.6 fL (ref 8.6–12.4)
Monocytes Absolute: 1 10*3/uL (ref 0.1–1.0)
NEUTROS ABS: 6.7 10*3/uL (ref 1.7–7.7)
NEUTROS PCT: 63 % (ref 43–77)
Platelets: 226 10*3/uL (ref 150–400)
RBC: 4.24 MIL/uL (ref 3.87–5.11)
RDW: 13.6 % (ref 11.5–15.5)
Rh Type: POSITIVE
Rubella: 1.52 Index — ABNORMAL HIGH (ref <0.90)
WBC: 10.7 10*3/uL — ABNORMAL HIGH (ref 4.0–10.5)

## 2015-03-23 LAB — CULTURE, URINE COMPREHENSIVE

## 2015-03-24 LAB — AFP, QUAD SCREEN
AFP: 23.3 ng/mL
Curr Gest Age: 16.6 wks.days
HCG TOTAL: 50.06 [IU]/mL
INH: 228.5 pg/mL
INTERPRETATION-AFP: POSITIVE — AB
MOM FOR AFP: 0.72
MOM FOR HCG: 1.81
MoM for INH: 1.63
OPEN SPINA BIFIDA: NEGATIVE
Osb Risk: <1:54600 {titer}
Tri 18 Scr Risk Est: NEGATIVE
UE3 VALUE: 0.61 ng/mL
uE3 Mom: 0.66

## 2015-03-24 LAB — CYTOLOGY - PAP

## 2015-03-25 LAB — CYSTIC FIBROSIS DIAGNOSTIC STUDY

## 2015-03-26 ENCOUNTER — Telehealth: Payer: Self-pay | Admitting: *Deleted

## 2015-03-26 DIAGNOSIS — Z3402 Encounter for supervision of normal first pregnancy, second trimester: Secondary | ICD-10-CM

## 2015-03-26 NOTE — Telephone Encounter (Signed)
Pt notified of AFP results being positive screening for Down's Syndrome.  Pt is scheduled for anatomy scan next week and appt was made with the genetic counselor as well.

## 2015-04-02 ENCOUNTER — Other Ambulatory Visit: Payer: Self-pay | Admitting: Advanced Practice Midwife

## 2015-04-02 ENCOUNTER — Ambulatory Visit (HOSPITAL_COMMUNITY)
Admission: RE | Admit: 2015-04-02 | Discharge: 2015-04-02 | Disposition: A | Discharge status: Home / Self Care | Payer: Medicaid Other | Source: Ambulatory Visit | Attending: Advanced Practice Midwife | Admitting: Advanced Practice Midwife

## 2015-04-02 VITALS — BP 120/90 | HR 89 | Wt 235.4 lb

## 2015-04-02 DIAGNOSIS — Z3402 Encounter for supervision of normal first pregnancy, second trimester: Secondary | ICD-10-CM

## 2015-04-02 DIAGNOSIS — Z315 Encounter for genetic counseling: Secondary | ICD-10-CM | POA: Insufficient documentation

## 2015-04-02 DIAGNOSIS — O289 Unspecified abnormal findings on antenatal screening of mother: Secondary | ICD-10-CM

## 2015-04-02 DIAGNOSIS — Z1389 Encounter for screening for other disorder: Secondary | ICD-10-CM

## 2015-04-02 DIAGNOSIS — O0932 Supervision of pregnancy with insufficient antenatal care, second trimester: Secondary | ICD-10-CM

## 2015-04-02 DIAGNOSIS — Z3A17 17 weeks gestation of pregnancy: Secondary | ICD-10-CM

## 2015-04-02 DIAGNOSIS — Z363 Encounter for antenatal screening for malformations: Secondary | ICD-10-CM

## 2015-04-02 DIAGNOSIS — O0930 Supervision of pregnancy with insufficient antenatal care, unspecified trimester: Secondary | ICD-10-CM

## 2015-04-02 DIAGNOSIS — O283 Abnormal ultrasonic finding on antenatal screening of mother: Secondary | ICD-10-CM | POA: Diagnosis not present

## 2015-04-02 DIAGNOSIS — O280 Abnormal hematological finding on antenatal screening of mother: Secondary | ICD-10-CM

## 2015-04-02 NOTE — Progress Notes (Signed)
Genetic Counseling  High-Risk Gestation Note  Appointment Date:  04/02/2015 Referred By: Hurshel PartyLeftwich-Kirby, Tracy A,* Date of Birth:  [birth year]   Pregnancy History: G2P0010 Estimated Date of Delivery: 08/29/15 Estimated Gestational Age: 6490w5d Attending: Particia NearingMartha Decker, MD   Tracy Lynn was seen for genetic counseling because of an increased risk for fetal Down syndrome based on Quad screen through [facility]. A friend accompanied the patient to today's visit.   In Summary:   1 in 183 Down syndrome risk from Quad screen  Detailed ultrasound performed today within normal limits  Declined amniocentesis and NIPS today  Considering NIPS and will discuss more with the father of the pregnancy  Patient will contact our office if she elects to pursue NIPS in the pregnancy  Family history significant for distant relative to the patient with deafness; see detailed discussion below   She was counseled regarding the Quad screen result and the associated 1 in 183 risk for fetal Down syndrome.  We reviewed chromosomes, nondisjunction, and the common features and variable prognosis of Down syndrome.  In addition, we reviewed the screen adjusted reduction in risks for trisomy 18 and ONTDs.  We also discussed other explanations for a screen positive result including: a gestational dating error, differences in maternal metabolism, and normal variation. They understand that this screening is not diagnostic for Down syndrome but provides a risk assessment.  We reviewed available screening options including noninvasive prenatal screening (NIPS)/cell free DNA (cf DNA) testing and detailed ultrasound.  She was counseled that screening tests are used to modify a patient's a priori risk for aneuploidy, typically based on age. This estimate provides a pregnancy specific risk assessment. We reviewed the benefits and limitations of each option. Specifically, we discussed the conditions for which each test  screens, the detection rates, and false positive rates of each. She was also counseled regarding diagnostic testing via amniocentesis. We reviewed the approximate 1 in 300-500 risk for complications for amniocentesis, including spontaneous pregnancy loss.   A complete ultrasound was performed today. The ultrasound report will be sent under separate cover. There were no visualized fetal anomalies or markers suggestive of aneuploidy. She understands that screening tests cannot rule out all birth defects or genetic syndromes. After careful consideration, Tracy Lynn declined amniocentesis. She also declined NIPS today but stated that she plans to discuss this with the father of the pregnancy and further consider this option. She plans to contact our office if she would like to pursue NIPS in the pregnancy.   Tracy Lynn was provided with written information regarding sickle cell anemia (SCA) including the carrier frequency and incidence in the African-American population, the availability of carrier testing and prenatal diagnosis if indicated.  In addition, we discussed that hemoglobinopathies are routinely screened for as part of the [place] newborn screening panel.  OB medical records indicate that sickle cell screening was previously performed and was negative. Hemoglobin electrophoresis is also available to screen for additional, less common, hemoglobin variants, in addition to hemoglobin S, if desired.    Both family histories were reviewed and found to be contributory for a paternal first cousin once removed with deafness. 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 She has no additional health concerns, and the underlying etiology is not known for her deafness. This relative has a son who reportedly does not have hearing loss. Hearing loss can have many causes including genetic factors, environmental  factors or a combination of both.  Sometimes hearing loss can occur as one feature of an underlying genetic condition or may be caused by a single nonworking gene. We discussed that recurrence risk for the current pregnancy is likely low given the reported family history and degree of relation. However, additional information regarding the underlying etiology may further refine recurrence risk assessment.   Additionally, the father of the pregnancy reportedly has a second cousin with autism. The patient was unsure if this was maternally or paternally related to the father of the pregnancy and did not have more information about this female relative. We discussed that autism is part of the spectrum of conditions referred to as Autistic spectrum disorders (ASD). We discussed that ASD's are among the most common neurodevelopmental disorders, with approximately 1 in 68 children meeting criteria for ASD, according to the Centers for Disease Control. Approximately 80% of individuals diagnosed are female. There is strong evidence that genetic factors play a critical role in development of ASD. There have been recent advances in identifying specific genetic causes of ASD, however, there are still many individuals for whom the etiology of the ASD is not known. We discussed that given the reported degree of relation, this family history is unlikely to alter the recurrence risk for autism in the pregnancy.  In the absence of an identified genetic etiology, prenatal screening or testing would not be available in the current pregnancy for the autism spectrum disorders in the family. Without further information regarding the provided family history, an accurate genetic risk cannot be calculated. Further genetic counseling is warranted if more information is obtained.  Tracy Lynn denied exposure to environmental toxins or chemical agents. She denied the use of tobacco or street drugs. She reportedly drank alcohol between 0-[redacted] weeks  gestation.  Prenatal alcohol exposure can increase the risk for growth delays, small head size, heart defects, eye and facial differences, as well as behavior problems and learning disabilities. The risk of these to occur tends to increase with the amount of alcohol consumed. However, because there is no identified safe amount of alcohol in pregnancy, it is recommended to completely avoid alcohol in pregnancy. Given the reported amount of exposure, risk for associated effects are likely low in the current pregnancy. She denied significant viral illnesses during the course of her pregnancy. Her medical and surgical histories were noncontributory.   I counseled Tracy Lynn for approximately 40 minutes regarding the above risks and available options.   Quinn Plowman, MS,  Certified Genetic Counselor 04/02/2015

## 2015-04-17 ENCOUNTER — Encounter: Payer: Medicaid Other | Admitting: Family

## 2015-04-23 ENCOUNTER — Ambulatory Visit (INDEPENDENT_AMBULATORY_CARE_PROVIDER_SITE_OTHER): Payer: Medicaid Other | Admitting: Obstetrics & Gynecology

## 2015-04-23 ENCOUNTER — Encounter: Payer: Self-pay | Admitting: Obstetrics & Gynecology

## 2015-04-23 VITALS — BP 134/75 | HR 97 | Wt 242.0 lb

## 2015-04-23 DIAGNOSIS — O99210 Obesity complicating pregnancy, unspecified trimester: Secondary | ICD-10-CM

## 2015-04-23 DIAGNOSIS — E669 Obesity, unspecified: Secondary | ICD-10-CM

## 2015-04-23 DIAGNOSIS — Z3402 Encounter for supervision of normal first pregnancy, second trimester: Secondary | ICD-10-CM | POA: Diagnosis not present

## 2015-04-23 NOTE — Patient Instructions (Signed)
Levonorgestrel intrauterine device (IUD) What is this medicine? LEVONORGESTREL IUD (LEE voe nor jes trel) is a contraceptive (birth control) device. The device is placed inside the uterus by a healthcare professional. It is used to prevent pregnancy and can also be used to treat heavy bleeding that occurs during your period. Depending on the device, it can be used for 3 to 5 years. This medicine may be used for other purposes; ask your health care provider or pharmacist if you have questions. What should I tell my health care provider before I take this medicine? They need to know if you have any of these conditions: -abnormal Pap smear -cancer of the breast, uterus, or cervix -diabetes -endometritis -genital or pelvic infection now or in the past -have more than one sexual partner or your partner has more than one partner -heart disease -history of an ectopic or tubal pregnancy -immune system problems -IUD in place -liver disease or tumor -problems with blood clots or take blood-thinners -use intravenous drugs -uterus of unusual shape -vaginal bleeding that has not been explained -an unusual or allergic reaction to levonorgestrel, other hormones, silicone, or polyethylene, medicines, foods, dyes, or preservatives -pregnant or trying to get pregnant -breast-feeding How should I use this medicine? This device is placed inside the uterus by a health care professional. Talk to your pediatrician regarding the use of this medicine in children. Special care may be needed. Overdosage: If you think you have taken too much of this medicine contact a poison control center or emergency room at once. NOTE: This medicine is only for you. Do not share this medicine with others. What if I miss a dose? This does not apply. What may interact with this medicine? Do not take this medicine with any of the following medications: -amprenavir -bosentan -fosamprenavir This medicine may also interact with  the following medications: -aprepitant -barbiturate medicines for inducing sleep or treating seizures -bexarotene -griseofulvin -medicines to treat seizures like carbamazepine, ethotoin, felbamate, oxcarbazepine, phenytoin, topiramate -modafinil -pioglitazone -rifabutin -rifampin -rifapentine -some medicines to treat HIV infection like atazanavir, indinavir, lopinavir, nelfinavir, tipranavir, ritonavir -St. John's wort -warfarin This list may not describe all possible interactions. Give your health care provider a list of all the medicines, herbs, non-prescription drugs, or dietary supplements you use. Also tell them if you smoke, drink alcohol, or use illegal drugs. Some items may interact with your medicine. What should I watch for while using this medicine? Visit your doctor or health care professional for regular check ups. See your doctor if you or your partner has sexual contact with others, becomes HIV positive, or gets a sexual transmitted disease. This product does not protect you against HIV infection (AIDS) or other sexually transmitted diseases. You can check the placement of the IUD yourself by reaching up to the top of your vagina with clean fingers to feel the threads. Do not pull on the threads. It is a good habit to check placement after each menstrual period. Call your doctor right away if you feel more of the IUD than just the threads or if you cannot feel the threads at all. The IUD may come out by itself. You may become pregnant if the device comes out. If you notice that the IUD has come out use a backup birth control method like condoms and call your health care provider. Using tampons will not change the position of the IUD and are okay to use during your period. What side effects may I notice from receiving this medicine?   Side effects that you should report to your doctor or health care professional as soon as possible: -allergic reactions like skin rash, itching or  hives, swelling of the face, lips, or tongue -fever, flu-like symptoms -genital sores -high blood pressure -no menstrual period for 6 weeks during use -pain, swelling, warmth in the leg -pelvic pain or tenderness -severe or sudden headache -signs of pregnancy -stomach cramping -sudden shortness of breath -trouble with balance, talking, or walking -unusual vaginal bleeding, discharge -yellowing of the eyes or skin Side effects that usually do not require medical attention (report to your doctor or health care professional if they continue or are bothersome): -acne -breast pain -change in sex drive or performance -changes in weight -cramping, dizziness, or faintness while the device is being inserted -headache -irregular menstrual bleeding within first 3 to 6 months of use -nausea This list may not describe all possible side effects. Call your doctor for medical advice about side effects. You may report side effects to FDA at 1-800-FDA-1088. Where should I keep my medicine? This does not apply. NOTE: This sheet is a summary. It may not cover all possible information. If you have questions about this medicine, talk to your doctor, pharmacist, or health care provider.    2016, [employer]vier/Gold Standard. (2011-02-03 13:54:04)  

## 2015-04-23 NOTE — Progress Notes (Signed)
Subjective:  Tracy Lynn is a 30 y.o. G2P0010 at 1178w6d being seen today for ongoing prenatal care.  She is currently monitored for the following issues for this high-risk pregnancy and has Injury of toe on right foot; Supervision of normal first pregnancy; Late prenatal care affecting pregnancy; and Abnormal maternal serum screening test on her problem list.; Obesity in pregnancy with abnormal weight gain.  Patient reports no complaints.  Contractions: Not present. Vag. Bleeding: None.  Movement: Absent. Denies leaking of fluid.   The following portions of the patient's history were reviewed and updated as appropriate: allergies, current medications, past family history, past medical history, past social history, past surgical history and problem list. Problem list updated.  Objective:   Filed Vitals:   04/23/15 1332  BP: 134/75  Pulse: 97  Weight: 242 lb (109.77 kg)    Fetal Status: Fetal Heart Rate (bpm): 150   Movement: Absent     General:  Alert, oriented and cooperative. Patient is in no acute distress.  Skin: Skin is warm and dry. No rash noted.   Cardiovascular: Normal heart rate noted  Respiratory: Normal respiratory effort, no problems with respiration noted  Abdomen: Soft, gravid, appropriate for gestational age. Pain/Pressure: Absent     Pelvic: Vag. Bleeding: None Vag D/C Character: Thin   Cervical exam deferred        Extremities: Normal range of motion.  Edema: None  Mental Status: Normal mood and affect. Normal behavior. Normal judgment and thought content.   Urinalysis: Urine Protein: 1+ Urine Glucose: Negative  Assessment and Plan:  Pregnancy: G2P0010 at 5278w6d  1. Encounter for supervision of normal first pregnancy in second trimester  2. Obesity affecting pregnancy  - Referral to Nutrition and Diabetes Services  Preterm labor symptoms and general obstetric precautions including but not limited to vaginal bleeding, contractions, leaking of fluid and fetal  movement were reviewed in detail with the patient. Please refer to After Visit Summary for other counseling recommendations.  Return in about 4 weeks (around 05/21/2015).   Lesly DukesKelly H Kaleth Koy, MD

## 2015-05-21 ENCOUNTER — Ambulatory Visit (INDEPENDENT_AMBULATORY_CARE_PROVIDER_SITE_OTHER): Payer: Medicaid Other | Admitting: Obstetrics & Gynecology

## 2015-05-21 VITALS — BP 118/75 | HR 95 | Wt 252.0 lb

## 2015-05-21 DIAGNOSIS — IMO0001 Reserved for inherently not codable concepts without codable children: Secondary | ICD-10-CM

## 2015-05-21 DIAGNOSIS — Z36 Encounter for antenatal screening of mother: Secondary | ICD-10-CM | POA: Diagnosis not present

## 2015-05-21 DIAGNOSIS — Z3402 Encounter for supervision of normal first pregnancy, second trimester: Secondary | ICD-10-CM

## 2015-05-21 DIAGNOSIS — R319 Hematuria, unspecified: Secondary | ICD-10-CM | POA: Diagnosis not present

## 2015-05-21 DIAGNOSIS — O3662X1 Maternal care for excessive fetal growth, second trimester, fetus 1: Secondary | ICD-10-CM

## 2015-05-21 NOTE — Progress Notes (Signed)
Blood-large  Culture sent

## 2015-05-21 NOTE — Progress Notes (Signed)
Subjective:  Tracy Lynn is a 30 y.o. G2P0010 at 788w6d being seen today for ongoing prenatal care.  She is currently monitored for the following issues for this high-risk pregnancy and has Injury of toe on right foot; Supervision of normal first pregnancy; Late prenatal care affecting pregnancy; and Abnormal maternal serum screening test on her problem list.--size > dates and excessive weight gain  Patient reports tearful but doew not want to talk about it.  .  Contractions: Not present. Vag. Bleeding: None.  Movement: Absent. Denies leaking of fluid.   The following portions of the patient's history were reviewed and updated as appropriate: allergies, current medications, past family history, past medical history, past social history, past surgical history and problem list. Problem list updated.  Objective:   Filed Vitals:   05/21/15 1348  BP: 118/75  Pulse: 95  Weight: 252 lb (114.306 kg)    Fetal Status: Fetal Heart Rate (bpm): 149   Movement: Absent     General:  Alert, oriented and cooperative. Patient is in no acute distress.  Skin: Skin is warm and dry. No rash noted.   Cardiovascular: Normal heart rate noted  Respiratory: Normal respiratory effort, no problems with respiration noted  Abdomen: Soft, gravid, appropriate for gestational age. Pain/Pressure: Absent     Pelvic: Vag. Bleeding: None Vag D/C Character: Thin   Cervical exam deferred        Extremities: Normal range of motion.  Edema: None  Mental Status: Normal mood and affect. Normal behavior. Normal judgment and thought content.   Urinalysis: Urine Protein: Trace Urine Glucose: Negative  Assessment and Plan:  Pregnancy: G2P0010 at 758w6d  1. Blood in the urine - CULTURE, URINE COMPREHENSIVE  2. Suspected macroscopic fetus, second trimester, fetus 1 - Referral to Nutrition and Diabetes Services - US MFM OB FOLLOW UP; Future -excessive weight gain -Pt would like NIPS now for increased risk of T21  3.  Social  issues Pt doesn't want to talk about about why she is sad.  She states she is safe and doesn't want to harm herself.  She inquired about how soon she can move once delivery has occurreed.  Denies depression.  Seems to have a relationship problem  4.  Fingertip numbness that spares 5th digit--Carpel tunel -wrist spints -if not improved will send to Dr. Karie Schwalbe  Preterm labor symptoms and general obstetric precautions including but not limited to vaginal bleeding, contractions, leaking of fluid and fetal movement were reviewed in detail with the patient. Please refer to After Visit Summary for other counseling recommendations.  Return in about 3 weeks (around 06/11/2015).   Lesly DukesKelly H La Dibella, MD

## 2015-05-23 LAB — CULTURE, URINE COMPREHENSIVE
COLONY COUNT: NO GROWTH
Organism ID, Bacteria: NO GROWTH

## 2015-05-28 ENCOUNTER — Other Ambulatory Visit: Payer: Self-pay | Admitting: *Deleted

## 2015-06-01 ENCOUNTER — Telehealth: Payer: Self-pay

## 2015-06-01 NOTE — Telephone Encounter (Signed)
Left message for patient to return call to office after 1 pm today. Armandina StammerJennifer Esmae Donathan RN BSN

## 2015-06-01 NOTE — Telephone Encounter (Signed)
Patient called and made aware that the Harmony test was within normal limits. Patient states understanding. Tracy StammerJennifer Howard RN BSN

## 2015-06-01 NOTE — Telephone Encounter (Signed)
-----   Message from Lesly DukesKelly H Leggett, MD sent at 05/31/2015  9:53 AM EDT ----- Normal Harmony.  RN to call

## 2015-06-04 ENCOUNTER — Ambulatory Visit (HOSPITAL_COMMUNITY)
Admission: RE | Admit: 2015-06-04 | Discharge: 2015-06-04 | Disposition: A | Discharge status: Home / Self Care | Payer: Medicaid Other | Source: Ambulatory Visit | Attending: Obstetrics & Gynecology | Admitting: Obstetrics & Gynecology

## 2015-06-04 ENCOUNTER — Other Ambulatory Visit: Payer: Self-pay | Admitting: Obstetrics & Gynecology

## 2015-06-04 ENCOUNTER — Encounter (HOSPITAL_COMMUNITY): Payer: Self-pay

## 2015-06-04 DIAGNOSIS — Z3A26 26 weeks gestation of pregnancy: Secondary | ICD-10-CM | POA: Diagnosis not present

## 2015-06-04 DIAGNOSIS — O283 Abnormal ultrasonic finding on antenatal screening of mother: Secondary | ICD-10-CM | POA: Diagnosis present

## 2015-06-04 DIAGNOSIS — O26842 Uterine size-date discrepancy, second trimester: Secondary | ICD-10-CM | POA: Insufficient documentation

## 2015-06-04 DIAGNOSIS — O289 Unspecified abnormal findings on antenatal screening of mother: Secondary | ICD-10-CM

## 2015-06-04 DIAGNOSIS — IMO0001 Reserved for inherently not codable concepts without codable children: Secondary | ICD-10-CM

## 2015-06-11 ENCOUNTER — Ambulatory Visit (INDEPENDENT_AMBULATORY_CARE_PROVIDER_SITE_OTHER): Payer: Medicaid Other | Admitting: Obstetrics & Gynecology

## 2015-06-11 VITALS — BP 125/69 | HR 99 | Wt 259.0 lb

## 2015-06-11 DIAGNOSIS — Z3403 Encounter for supervision of normal first pregnancy, third trimester: Secondary | ICD-10-CM

## 2015-06-11 DIAGNOSIS — E669 Obesity, unspecified: Secondary | ICD-10-CM

## 2015-06-11 DIAGNOSIS — O99213 Obesity complicating pregnancy, third trimester: Secondary | ICD-10-CM | POA: Insufficient documentation

## 2015-06-11 DIAGNOSIS — O280 Abnormal hematological finding on antenatal screening of mother: Secondary | ICD-10-CM

## 2015-06-11 DIAGNOSIS — Z3482 Encounter for supervision of other normal pregnancy, second trimester: Secondary | ICD-10-CM

## 2015-06-11 DIAGNOSIS — O289 Unspecified abnormal findings on antenatal screening of mother: Secondary | ICD-10-CM

## 2015-06-11 DIAGNOSIS — O0930 Supervision of pregnancy with insufficient antenatal care, unspecified trimester: Secondary | ICD-10-CM

## 2015-06-11 LAB — CBC
HCT: 33.6 % — ABNORMAL LOW (ref 35.0–45.0)
Hemoglobin: 11.4 g/dL — ABNORMAL LOW (ref 11.7–15.5)
MCH: 30.6 pg (ref 27.0–33.0)
MCHC: 33.9 g/dL (ref 32.0–36.0)
MCV: 90.1 fL (ref 80.0–100.0)
MPV: 9.3 fL (ref 7.5–12.5)
PLATELETS: 192 10*3/uL (ref 140–400)
RBC: 3.73 MIL/uL — AB (ref 3.80–5.10)
RDW: 13.2 % (ref 11.0–15.0)
WBC: 12.5 10*3/uL — ABNORMAL HIGH (ref 3.8–10.8)

## 2015-06-11 NOTE — Progress Notes (Signed)
Subjective:  Tracy Lynn is a 30 y.o. G2P0010 at 7635w6d being seen today for ongoing prenatal care.  She is currently monitored for the following issues for this high-risk pregnancy and has Injury of toe on right foot; Supervision of normal first pregnancy; Late prenatal care affecting pregnancy; Abnormal maternal serum screening test; and Obesity affecting pregnancy in third trimester on her problem list.  Patient reports no complaints.   .  .   . Denies leaking of fluid.   The following portions of the patient's history were reviewed and updated as appropriate: allergies, current medications, past family history, past medical history, past social history, past surgical history and problem list. Problem list updated.  Objective:  There were no vitals filed for this visit.  Fetal Status:           General:  Alert, oriented and cooperative. Patient is in no acute distress.  Skin: Skin is warm and dry. No rash noted.   Cardiovascular: Normal heart rate noted  Respiratory: Normal respiratory effort, no problems with respiration noted  Abdomen: Soft, gravid, appropriate for gestational age.       Pelvic:       Cervical exam deferred        Extremities: Normal range of motion.     Mental Status: Normal mood and affect. Normal behavior. Normal judgment and thought content.   Urinalysis:      Assessment and Plan:  Pregnancy: G2P0010 at 6735w6d  1. Normal pregnancy in multigravida in second trimester  - Glucose Tolerance, 1 HR (50g) - CBC - HIV antibody (with reflex) - RPR - Tdap vaccine greater than or equal to 7yo IM  2. Encounter for supervision of normal first pregnancy in third trimester   3. Late prenatal care affecting pregnancy   4. Abnormal maternal serum screening test - normal NIPS  5. Obesity affecting pregnancy in third trimester Glucola today, discussed rec'd weight gain and risks  Preterm labor symptoms and general obstetric precautions including but not limited  to vaginal bleeding, contractions, leaking of fluid and fetal movement were reviewed in detail with the patient. Please refer to After Visit Summary for other counseling recommendations.  No Follow-up on file.   Allie BossierMyra C Aisia Correira, MD

## 2015-06-12 LAB — GLUCOSE TOLERANCE, 1 HOUR (50G) W/O FASTING: GLUCOSE, 1 HR, GESTATIONAL: 137 mg/dL (ref <140)

## 2015-06-12 LAB — HIV ANTIBODY (ROUTINE TESTING W REFLEX): HIV: NONREACTIVE

## 2015-06-13 LAB — RPR

## 2015-06-16 ENCOUNTER — Telehealth: Payer: Self-pay | Admitting: *Deleted

## 2015-06-16 DIAGNOSIS — Z3403 Encounter for supervision of normal first pregnancy, third trimester: Secondary | ICD-10-CM

## 2015-06-16 NOTE — Telephone Encounter (Signed)
Pt notified of abnormal 1 hr GTT and 3 hr fasting GTT scheduled for tomorrow @ 8:30

## 2015-06-17 ENCOUNTER — Other Ambulatory Visit: Payer: Medicaid Other

## 2015-06-18 ENCOUNTER — Telehealth: Payer: Self-pay | Admitting: *Deleted

## 2015-06-18 DIAGNOSIS — Z3403 Encounter for supervision of normal first pregnancy, third trimester: Secondary | ICD-10-CM

## 2015-06-18 LAB — GLUCOSE TOLERANCE, 3 HOURS
GLUCOSE, 1 HOUR-GESTATIONAL: 132 mg/dL (ref <190)
Glucose Tolerance, 2 hour: 99 mg/dL (ref <165)
Glucose Tolerance, Fasting: 79 mg/dL (ref 65–104)
Glucose, GTT - 3 Hour: 93 mg/dL (ref <145)

## 2015-06-18 NOTE — Telephone Encounter (Signed)
Pt notified of 3 hr GTT normal results

## 2015-07-02 ENCOUNTER — Ambulatory Visit (INDEPENDENT_AMBULATORY_CARE_PROVIDER_SITE_OTHER): Payer: Medicaid Other | Admitting: Obstetrics & Gynecology

## 2015-07-02 VITALS — BP 121/72 | HR 95 | Wt 263.0 lb

## 2015-07-02 DIAGNOSIS — O289 Unspecified abnormal findings on antenatal screening of mother: Secondary | ICD-10-CM

## 2015-07-02 DIAGNOSIS — Z3403 Encounter for supervision of normal first pregnancy, third trimester: Secondary | ICD-10-CM

## 2015-07-02 DIAGNOSIS — O0930 Supervision of pregnancy with insufficient antenatal care, unspecified trimester: Secondary | ICD-10-CM

## 2015-07-02 DIAGNOSIS — O280 Abnormal hematological finding on antenatal screening of mother: Secondary | ICD-10-CM

## 2015-07-02 DIAGNOSIS — E669 Obesity, unspecified: Secondary | ICD-10-CM

## 2015-07-02 DIAGNOSIS — O99213 Obesity complicating pregnancy, third trimester: Secondary | ICD-10-CM

## 2015-07-02 NOTE — Progress Notes (Signed)
Subjective:  Tracy BoydenJasmyne Lynn is a 30 y.o. G2P0010 at 1923w6d being seen today for ongoing prenatal care.  She is currently monitored for the following issues for this low-risk pregnancy and has Injury of toe on right foot; Supervision of normal first pregnancy; Late prenatal care affecting pregnancy; Abnormal maternal serum screening test; and Obesity affecting pregnancy in third trimester on her problem list.  Patient reports no complaints.  Contractions: Not present. Vag. Bleeding: None.  Movement: Present. Denies leaking of fluid.   The following portions of the patient's history were reviewed and updated as appropriate: allergies, current medications, past family history, past medical history, past social history, past surgical history and problem list. Problem list updated.  Objective:   Filed Vitals:   07/02/15 1546  BP: 121/72  Pulse: 95  Weight: 263 lb (119.296 kg)    Fetal Status:     Movement: Present     General:  Alert, oriented and cooperative. Patient is in no acute distress.  Skin: Skin is warm and dry. No rash noted.   Cardiovascular: Normal heart rate noted  Respiratory: Normal respiratory effort, no problems with respiration noted  Abdomen: Soft, gravid, appropriate for gestational age. Pain/Pressure: Absent     Pelvic: Cervical exam deferred        Extremities: Normal range of motion.  Edema: Trace  Mental Status: Normal mood and affect. Normal behavior. Normal judgment and thought content.   Urinalysis: Urine Protein: Negative Urine Glucose: Negative  Assessment and Plan:  Pregnancy: G2P0010 at 5623w6d  1. Abnormal maternal serum screening test   2. Late prenatal care affecting pregnancy   3. Obesity affecting pregnancy in third trimester   4. Encounter for supervision of normal first pregnancy in third trimester   Preterm labor symptoms and general obstetric precautions including but not limited to vaginal bleeding, contractions, leaking of fluid and fetal  movement were reviewed in detail with the patient. Please refer to After Visit Summary for other counseling recommendations.  Return in about 3 weeks (around 07/23/2015).   Allie BossierMyra C Kamelia Lampkins, MD

## 2015-07-23 ENCOUNTER — Encounter: Payer: Self-pay | Admitting: Obstetrics & Gynecology

## 2015-07-23 ENCOUNTER — Ambulatory Visit (INDEPENDENT_AMBULATORY_CARE_PROVIDER_SITE_OTHER): Payer: Medicaid Other | Admitting: Obstetrics & Gynecology

## 2015-07-23 VITALS — BP 128/80 | HR 93 | Wt 270.0 lb

## 2015-07-23 DIAGNOSIS — O99213 Obesity complicating pregnancy, third trimester: Secondary | ICD-10-CM

## 2015-07-23 DIAGNOSIS — O280 Abnormal hematological finding on antenatal screening of mother: Secondary | ICD-10-CM

## 2015-07-23 DIAGNOSIS — E669 Obesity, unspecified: Secondary | ICD-10-CM

## 2015-07-23 DIAGNOSIS — O289 Unspecified abnormal findings on antenatal screening of mother: Secondary | ICD-10-CM

## 2015-07-23 DIAGNOSIS — Z3403 Encounter for supervision of normal first pregnancy, third trimester: Secondary | ICD-10-CM

## 2015-07-23 DIAGNOSIS — O0930 Supervision of pregnancy with insufficient antenatal care, unspecified trimester: Secondary | ICD-10-CM

## 2015-07-23 NOTE — Patient Instructions (Signed)

## 2015-07-23 NOTE — Progress Notes (Signed)
Subjective:  Tracy Lynn is a 30 y.o. G2P0010 at 4460w6d being seen today for ongoing prenatal care.  She is currently monitored for the following issues for this low-risk pregnancy and has Injury of toe on right foot; Supervision of normal first pregnancy; Late prenatal care affecting pregnancy; Abnormal maternal serum screening test; and Obesity affecting pregnancy in third trimester on her problem list.  Patient reports no complaints.  Contractions: Not present. Vag. Bleeding: None.  Movement: Present. Denies leaking of fluid.   The following portions of the patient's history were reviewed and updated as appropriate: allergies, current medications, past family history, past medical history, past social history, past surgical history and problem list. Problem list updated.  Objective:   Filed Vitals:   07/23/15 1421  BP: 128/80  Pulse: 93  Weight: 270 lb (122.471 kg)    Fetal Status: Fetal Heart Rate (bpm): 151 Fundal Height: 35 cm Movement: Present     General:  Alert, oriented and cooperative. Patient is in no acute distress.  Skin: Skin is warm and dry. No rash noted.   Cardiovascular: Normal heart rate noted  Respiratory: Normal respiratory effort, no problems with respiration noted  Abdomen: Soft, gravid, appropriate for gestational age. Pain/Pressure: Absent     Pelvic:  Cervical exam deferred        Extremities: Normal range of motion.  Edema: Trace  Mental Status: Normal mood and affect. Normal behavior. Normal judgment and thought content.   Urinalysis: Urine Protein: Negative Urine Glucose: Negative  Assessment and Plan:  Pregnancy: G2P0010 at 4460w6d  1. Encounter for supervision of normal first pregnancy in third trimester  2. Late prenatal care affecting pregnancy  3. Abnormal maternal serum screening test  4. Obesity affecting pregnancy in third trimester Discussed healthy diet   Preterm labor symptoms and general obstetric precautions including but not limited  to vaginal bleeding, contractions, leaking of fluid and fetal movement were reviewed in detail with the patient. Please refer to After Visit Summary for other counseling recommendations.  Return in about 2 weeks (around 08/06/2015).   Willodean Rosenthalarolyn Harraway-Smith, MD

## 2015-08-06 ENCOUNTER — Encounter: Payer: Medicaid Other | Admitting: Obstetrics & Gynecology

## 2015-08-11 ENCOUNTER — Encounter: Payer: Medicaid Other | Admitting: Obstetrics & Gynecology

## 2015-08-20 ENCOUNTER — Encounter: Payer: Medicaid Other | Admitting: Obstetrics & Gynecology

## 2015-08-31 ENCOUNTER — Encounter (INDEPENDENT_AMBULATORY_CARE_PROVIDER_SITE_OTHER): Payer: Self-pay

## 2015-08-31 ENCOUNTER — Ambulatory Visit (INDEPENDENT_AMBULATORY_CARE_PROVIDER_SITE_OTHER): Payer: Medicaid Other | Admitting: Obstetrics & Gynecology

## 2015-08-31 VITALS — BP 126/78 | HR 90 | Wt 283.0 lb

## 2015-08-31 DIAGNOSIS — O0933 Supervision of pregnancy with insufficient antenatal care, third trimester: Secondary | ICD-10-CM

## 2015-08-31 DIAGNOSIS — E669 Obesity, unspecified: Secondary | ICD-10-CM

## 2015-08-31 DIAGNOSIS — O99213 Obesity complicating pregnancy, third trimester: Secondary | ICD-10-CM

## 2015-09-01 NOTE — Progress Notes (Signed)
Subjective:  Tracy Lynn is a 30 y.o. G2P0010 at 6593w4d being seen today for ongoing prenatal care.  She is currently monitored for the following issues for this high-risk pregnancy and has Injury of toe on right foot; Supervision of normal first pregnancy; Late prenatal care affecting pregnancy; Abnormal maternal serum screening test; and Obesity affecting pregnancy in third trimester on her problem list.  Patient reports no complaints.  Contractions: Not present. Vag. Bleeding: None.  Movement: Present. Denies leaking of fluid.   The following portions of the patient's history were reviewed and updated as appropriate: allergies, current medications, past family history, past medical history, past social history, past surgical history and problem list. Problem list updated.  Objective:   Vitals:   08/31/15 1443  BP: 126/78  Pulse: 90  Weight: 283 lb (128.4 kg)    Fetal Status: Fetal Heart Rate (bpm): 145 Fundal Height: 41 cm Movement: Present     General:  Alert, oriented and cooperative. Patient is in no acute distress.  Skin: Skin is warm and dry. No rash noted.   Cardiovascular: Normal heart rate noted  Respiratory: Normal respiratory effort, no problems with respiration noted  Abdomen: Soft, gravid, appropriate for gestational age. Pain/Pressure: Absent     Pelvic:  Cervical exam performed        Extremities: Normal range of motion.  Edema: Trace  Mental Status: Normal mood and affect. Normal behavior. Normal judgment and thought content.   Urinalysis: Urine Protein: Negative Urine Glucose: Negative  Assessment and Plan:  Pregnancy: G2P0010 at 6093w4d  1.  Excessive weight gain this pregnancy  2.  Lapse in prenatal care --Needs GBS and cultures  3.  NST at 40 weeks. Term labor symptoms and general obstetric precautions including but not limited to vaginal bleeding, contractions, leaking of fluid and fetal movement were reviewed in detail with the patient. Please refer to  After Visit Summary for other counseling recommendations.  Return in about 1 week (around 09/07/2015).   Lesly DukesKelly H Aarion Metzgar, MD

## 2015-09-04 ENCOUNTER — Other Ambulatory Visit (HOSPITAL_COMMUNITY)
Admission: RE | Admit: 2015-09-04 | Discharge: 2015-09-04 | Disposition: A | Discharge status: Home / Self Care | Payer: Medicaid Other | Source: Ambulatory Visit | Attending: Advanced Practice Midwife | Admitting: Advanced Practice Midwife

## 2015-09-04 ENCOUNTER — Ambulatory Visit (INDEPENDENT_AMBULATORY_CARE_PROVIDER_SITE_OTHER): Payer: Medicaid Other | Admitting: *Deleted

## 2015-09-04 VITALS — BP 125/73 | HR 97 | Wt 288.0 lb

## 2015-09-04 DIAGNOSIS — Z3493 Encounter for supervision of normal pregnancy, unspecified, third trimester: Secondary | ICD-10-CM

## 2015-09-04 DIAGNOSIS — Z113 Encounter for screening for infections with a predominantly sexual mode of transmission: Secondary | ICD-10-CM

## 2015-09-04 DIAGNOSIS — Z36 Encounter for antenatal screening of mother: Secondary | ICD-10-CM | POA: Diagnosis not present

## 2015-09-04 LAB — OB RESULTS CONSOLE GBS: GBS: POSITIVE

## 2015-09-07 ENCOUNTER — Ambulatory Visit (INDEPENDENT_AMBULATORY_CARE_PROVIDER_SITE_OTHER): Payer: Medicaid Other | Admitting: Advanced Practice Midwife

## 2015-09-07 DIAGNOSIS — Z3403 Encounter for supervision of normal first pregnancy, third trimester: Secondary | ICD-10-CM

## 2015-09-07 DIAGNOSIS — O99820 Streptococcus B carrier state complicating pregnancy: Secondary | ICD-10-CM | POA: Insufficient documentation

## 2015-09-07 LAB — URINE CYTOLOGY ANCILLARY ONLY
CHLAMYDIA, DNA PROBE: NEGATIVE
NEISSERIA GONORRHEA: NEGATIVE

## 2015-09-07 NOTE — Patient Instructions (Signed)
Vaginal Delivery °During delivery, your health care provider will help you give birth to your baby. During a vaginal delivery, you will work to push the baby out of your vagina. However, before you can push your baby out, a few things need to happen. The opening of your uterus (cervix) has to soften, thin out, and open up (dilate) all the way to 10 cm. Also, your baby has to move down from the uterus into your vagina.  °SIGNS OF LABOR  °Your health care provider will first need to make sure you are in labor. Signs of labor include:  °· Passing what is called the mucous plug before labor begins. This is a small amount of blood-stained mucus. °· Having regular, painful uterine contractions.   °· The time between contractions gets shorter.   °· The discomfort and pain gradually get more intense. °· Contraction pains get worse when walking and do not go away when resting.   °· Your cervix becomes thinner (effacement) and dilates. °BEFORE THE DELIVERY °Once you are in labor and admitted into the hospital or care center, your health care provider may do the following:  °· Perform a complete physical exam. °· Review any complications related to pregnancy or labor.  °· Check your blood pressure, pulse, temperature, and heart rate (vital signs).   °· Determine if, and when, the rupture of amniotic membranes occurred. °· Do a vaginal exam (using a sterile glove and lubricant) to determine:   °¨ The position (presentation) of the baby. Is the baby's head presenting first (vertex) in the birth canal (vagina), or are the feet or buttocks first (breech)?   °¨ The level (station) of the baby's head within the birth canal.   °¨ The effacement and dilatation of the cervix.   °· An electronic fetal monitor is usually placed on your abdomen when you first arrive. This is used to monitor your contractions and the baby's heart rate. °¨ When the monitor is on your abdomen (external fetal monitor), it can only pick up the frequency and  length of your contractions. It cannot tell the strength of your contractions. °¨ If it becomes necessary for your health care provider to know exactly how strong your contractions are or to see exactly what the baby's heart rate is doing, an internal monitor may be inserted into your vagina and uterus. Your health care provider will discuss the benefits and risks of using an internal monitor and obtain your permission before inserting the device. °¨ Continuous fetal monitoring may be needed if you have an epidural, are receiving certain medicines (such as oxytocin), or have pregnancy or labor complications. °· An IV access tube may be placed into a vein in your arm to deliver fluids and medicines if necessary. °THREE STAGES OF LABOR AND DELIVERY °Normal labor and delivery is divided into three stages. °First Stage °This stage starts when you begin to contract regularly and your cervix begins to efface and dilate. It ends when your cervix is completely open (fully dilated). The first stage is the longest stage of labor and can last from 3 hours to 15 hours.  °Several methods are available to help with labor pain. You and your health care provider will decide which option is best for you. Options include:  °· Opioid medicines. These are strong pain medicines that you can get through your IV tube or as a shot into your muscle. These medicines lessen pain but do not make it go away completely.  °· Epidural. A medicine is given through a thin tube that   is inserted in your back. The medicine numbs the lower part of your body and prevents any pain in that area. °· Paracervical pain medicine. This is an injection of an anesthetic on each side of your cervix.   °· You may request natural childbirth, which does not involve the use of pain medicines or an epidural during labor and delivery. Instead, you will use other things, such as breathing exercises, to help cope with the pain. °Second Stage °The second stage of labor  begins when your cervix is fully dilated at 10 cm. It continues until you push your baby down through the birth canal and the baby is born. This stage can take only minutes or several hours. °· The location of your baby's head as it moves through the birth canal is reported as a number called a station. If the baby's head has not started its descent, the station is described as being at minus 3 (-3). When your baby's head is at the zero station, it is at the middle of the birth canal and is engaged in the pelvis. The station of your baby helps indicate the progress of the second stage of labor. °· When your baby is born, your health care provider may hold the baby with his or her head lowered to prevent amniotic fluid, mucus, and blood from getting into the baby's lungs. The baby's mouth and nose may be suctioned with a small bulb syringe to remove any additional fluid. °· Your health care provider may then place the baby on your stomach. It is important to keep the baby from getting cold. To do this, the health care provider will dry the baby off, place the baby directly on your skin (with no blankets between you and the baby), and cover the baby with warm, dry blankets.   °· The umbilical cord is cut. °Third Stage °During the third stage of labor, your health care provider will deliver the placenta (afterbirth) and make sure your bleeding is under control. The delivery of the placenta usually takes about 5 minutes but can take up to 30 minutes. After the placenta is delivered, a medicine may be given either by IV or injection to help contract the uterus and control bleeding. If you are planning to breastfeed, you can try to do so now. °After you deliver the placenta, your uterus should contract and get very firm. If your uterus does not remain firm, your health care provider will massage it. This is important because the contraction of the uterus helps cut off bleeding at the site where the placenta was attached  to your uterus. If your uterus does not contract properly and stay firm, you may continue to bleed heavily. If there is a lot of bleeding, medicines may be given to contract the uterus and stop the bleeding.  °  °This information is not intended to replace advice given to you by your health care provider. Make sure you discuss any questions you have with your health care provider. °  °Document Released: 10/13/2007 Document Revised: 01/24/2014 Document Reviewed: 08/31/2011 °Elsevier Interactive Patient Education ©2016 [employer] ° °

## 2015-09-07 NOTE — Progress Notes (Signed)
Subjective:  Tracy BoydenJasmyne Lynn is a 30 y.o. G2P0010 at 387w3d being seen today for ongoing prenatal care.  She is currently monitored for the following issues for this low-risk pregnancy and has Injury of toe on right Lynn; Supervision of normal first pregnancy; Late prenatal care affecting pregnancy; Abnormal maternal serum screening test; Obesity affecting pregnancy in third trimester; and GBS (group B Streptococcus carrier), +RV culture, currently pregnant on her problem list.  Patient reports no complaints.  Contractions: Not present. Vag. Bleeding: None.  Movement: Present. Denies leaking of fluid.   The following portions of the patient's history were reviewed and updated as appropriate: allergies, current medications, past family history, past medical history, past social history, past surgical history and problem list. Problem list updated.  Objective:   Vitals:   09/07/15 0923  BP: 130/80  Pulse: (!) 101  Weight: 134.3 kg (296 lb)    Fetal Status:     Movement: Present  Presentation: Vertex   NST reactive with good variability, accels and no decels. Rare UCs   General:  Alert, oriented and cooperative. Patient is in no acute distress.  Skin: Skin is warm and dry. No rash noted.   Cardiovascular: Normal heart rate noted  Respiratory: Normal respiratory effort, no problems with respiration noted  Abdomen: Soft, gravid, appropriate for gestational age. Pain/Pressure: Present     Pelvic:  Cervical exam deferred        Extremities: Normal range of motion.  Edema: Moderate pitting, indentation subsides rapidly  Mental Status: Normal mood and affect. Normal behavior. Normal judgment and thought content.   Urinalysis: Urine Protein: Trace Urine Glucose: Negative  Assessment and Plan:  Pregnancy: G2P0010 at 737w3d . Term labor symptoms and general obstetric precautions including but not limited to vaginal bleeding, contractions, leaking of fluid and fetal movement were reviewed in detail  with the patient. Please refer to After Visit Summary for other counseling recommendations.   Discussed proposed induction of labor for post dates.   She has decided not to do waterbirth, but does want to do noninterventive birth as much as possible. Wants to wait a few more days for IOL until at least Monday, when she will be 467w3d.    I recommend one more NST Thurs/Friday since we are waiting.     Tracy Lynn, CNM

## 2015-09-08 LAB — CULTURE, STREPTOCOCCUS GRP B W/SUSCEPT

## 2015-09-10 ENCOUNTER — Ambulatory Visit (INDEPENDENT_AMBULATORY_CARE_PROVIDER_SITE_OTHER): Payer: Medicaid Other | Admitting: *Deleted

## 2015-09-10 ENCOUNTER — Telehealth: Payer: Self-pay

## 2015-09-10 VITALS — BP 122/77 | HR 98 | Wt 289.0 lb

## 2015-09-10 DIAGNOSIS — O480 Post-term pregnancy: Secondary | ICD-10-CM

## 2015-09-10 NOTE — Telephone Encounter (Signed)
Spoke with pt to inform her that her induction is set up for September 13, 2015 at 8:00 am and that she needs to arrive no later than 7:30 am

## 2015-09-11 ENCOUNTER — Encounter (HOSPITAL_COMMUNITY): Payer: Self-pay | Admitting: *Deleted

## 2015-09-11 ENCOUNTER — Telehealth (HOSPITAL_COMMUNITY): Payer: Self-pay | Admitting: *Deleted

## 2015-09-11 NOTE — Telephone Encounter (Signed)
Preadmission screen  

## 2015-09-13 ENCOUNTER — Encounter (HOSPITAL_COMMUNITY): Payer: Self-pay

## 2015-09-13 ENCOUNTER — Inpatient Hospital Stay (HOSPITAL_COMMUNITY)
Admission: RE | Admit: 2015-09-13 | Discharge: 2015-09-17 | DRG: 765 | Disposition: A | Discharge status: Home / Self Care | Payer: Medicaid Other | Source: Ambulatory Visit | Attending: Obstetrics and Gynecology | Admitting: Obstetrics and Gynecology

## 2015-09-13 DIAGNOSIS — Z833 Family history of diabetes mellitus: Secondary | ICD-10-CM

## 2015-09-13 DIAGNOSIS — O9952 Diseases of the respiratory system complicating childbirth: Secondary | ICD-10-CM | POA: Diagnosis present

## 2015-09-13 DIAGNOSIS — Z8249 Family history of ischemic heart disease and other diseases of the circulatory system: Secondary | ICD-10-CM | POA: Diagnosis not present

## 2015-09-13 DIAGNOSIS — Z3403 Encounter for supervision of normal first pregnancy, third trimester: Secondary | ICD-10-CM

## 2015-09-13 DIAGNOSIS — Z87891 Personal history of nicotine dependence: Secondary | ICD-10-CM | POA: Diagnosis not present

## 2015-09-13 DIAGNOSIS — J45909 Unspecified asthma, uncomplicated: Secondary | ICD-10-CM | POA: Diagnosis present

## 2015-09-13 DIAGNOSIS — O99214 Obesity complicating childbirth: Secondary | ICD-10-CM | POA: Diagnosis present

## 2015-09-13 DIAGNOSIS — O99824 Streptococcus B carrier state complicating childbirth: Secondary | ICD-10-CM | POA: Diagnosis present

## 2015-09-13 DIAGNOSIS — Z6841 Body Mass Index (BMI) 40.0 and over, adult: Secondary | ICD-10-CM

## 2015-09-13 DIAGNOSIS — Z3A41 41 weeks gestation of pregnancy: Secondary | ICD-10-CM | POA: Diagnosis not present

## 2015-09-13 DIAGNOSIS — O280 Abnormal hematological finding on antenatal screening of mother: Secondary | ICD-10-CM

## 2015-09-13 DIAGNOSIS — O480 Post-term pregnancy: Secondary | ICD-10-CM | POA: Diagnosis present

## 2015-09-13 DIAGNOSIS — O0930 Supervision of pregnancy with insufficient antenatal care, unspecified trimester: Secondary | ICD-10-CM

## 2015-09-13 DIAGNOSIS — O99213 Obesity complicating pregnancy, third trimester: Secondary | ICD-10-CM

## 2015-09-13 LAB — CBC
HEMATOCRIT: 34.4 % — AB (ref 36.0–46.0)
Hemoglobin: 12.1 g/dL (ref 12.0–15.0)
MCH: 29.9 pg (ref 26.0–34.0)
MCHC: 35.2 g/dL (ref 30.0–36.0)
MCV: 84.9 fL (ref 78.0–100.0)
Platelets: 166 10*3/uL (ref 150–400)
RBC: 4.05 MIL/uL (ref 3.87–5.11)
RDW: 14.6 % (ref 11.5–15.5)
WBC: 12.5 10*3/uL — ABNORMAL HIGH (ref 4.0–10.5)

## 2015-09-13 LAB — TYPE AND SCREEN
ABO/RH(D): B POS
ANTIBODY SCREEN: NEGATIVE

## 2015-09-13 MED ORDER — MISOPROSTOL 25 MCG QUARTER TABLET
25.0000 ug | ORAL_TABLET | ORAL | Status: DC | PRN
  Administered 2015-09-13 (×2): 25 ug via VAGINAL
  Filled 2015-09-13 (×2): qty 0.25

## 2015-09-13 MED ORDER — DEXTROSE 5 % IV SOLN
2.5000 10*6.[IU] | INTRAVENOUS | Status: DC
  Filled 2015-09-13 (×2): qty 2.5

## 2015-09-13 MED ORDER — ACETAMINOPHEN 325 MG PO TABS: 650.0000 mg | ORAL_TABLET | ORAL | Status: DC | PRN

## 2015-09-13 MED ORDER — SOD CITRATE-CITRIC ACID 500-334 MG/5ML PO SOLN
30.0000 mL | ORAL | Status: DC | PRN
  Administered 2015-09-14: 30 mL via ORAL
  Filled 2015-09-13: qty 15

## 2015-09-13 MED ORDER — TERBUTALINE SULFATE 1 MG/ML IJ SOLN
0.2500 mg | Freq: Once | INTRAMUSCULAR | Status: AC | PRN
  Administered 2015-09-14: 0.25 mg via SUBCUTANEOUS

## 2015-09-13 MED ORDER — OXYCODONE-ACETAMINOPHEN 5-325 MG PO TABS: 1.0000 | ORAL_TABLET | ORAL | Status: DC | PRN

## 2015-09-13 MED ORDER — OXYCODONE-ACETAMINOPHEN 5-325 MG PO TABS
2.0000 | ORAL_TABLET | ORAL | Status: DC | PRN
Start: 2015-09-13 — End: 2015-09-14

## 2015-09-13 MED ORDER — LACTATED RINGERS IV SOLN: INTRAVENOUS | Status: DC

## 2015-09-13 MED ORDER — PENICILLIN G POTASSIUM 5000000 UNITS IJ SOLR: 2.5000 10*6.[IU] | INTRAMUSCULAR | Status: DC

## 2015-09-13 MED ORDER — PENICILLIN G POTASSIUM 5000000 UNITS IJ SOLR
5.0000 10*6.[IU] | Freq: Once | INTRAMUSCULAR | Status: DC
  Filled 2015-09-13: qty 5

## 2015-09-13 MED ORDER — OXYTOCIN 40 UNITS IN LACTATED RINGERS INFUSION - SIMPLE MED: 2.5000 [IU]/h | INTRAVENOUS | Status: DC

## 2015-09-13 MED ORDER — OXYTOCIN BOLUS FROM INFUSION: 500.0000 mL | Freq: Once | INTRAVENOUS | Status: DC

## 2015-09-13 MED ORDER — LACTATED RINGERS IV SOLN: 500.0000 mL | INTRAVENOUS | Status: DC | PRN

## 2015-09-13 MED ORDER — ONDANSETRON HCL 4 MG/2ML IJ SOLN: 4.0000 mg | Freq: Four times a day (QID) | INTRAMUSCULAR | Status: DC | PRN

## 2015-09-13 MED ORDER — PENICILLIN G POTASSIUM 5000000 UNITS IJ SOLR
5.0000 10*6.[IU] | Freq: Once | INTRAMUSCULAR | Status: DC
Start: 2015-09-13 — End: 2015-09-13

## 2015-09-13 MED ORDER — LIDOCAINE HCL (PF) 1 % IJ SOLN: 30.0000 mL | INTRAMUSCULAR | Status: DC | PRN

## 2015-09-13 NOTE — Progress Notes (Signed)
Spoke with patient regarding delay in IOL. Labor precautions given. Pt encouraged to eat light breakfast. Pt denies LOF, reports good FM, denies contractions. Provider notified.

## 2015-09-13 NOTE — Anesthesia Pain Management Evaluation Note (Signed)
  CRNA Pain Management Visit Note  Patient: Tracy Lynn, 30 y.o., female  "Hello [facility] am a [facility]. We have an anesthesia team available at all times to provide care throughout the hospital, including epidural management and anesthesia for C-section. I don't know your plan for the delivery whether it a natural birth, water birth, IV sedation, nitrous supplementation, doula or epidural, but we want to meet your pain goals."   1.Was your pain managed to your expectations on prior hospitalizations?   Yes   2.What is your expectation for pain management during this hospitalization?     Labor support without medications, Epidural and IV pain meds  3.How can we help you reach that goal? Flexible options  Record the patient's initial score and the patient's pain goal.   Pain: 0  Pain Goal: 6 [facility] wants you to be able to say your pain was always managed very well.  Mitch Arquette 09/13/2015

## 2015-09-13 NOTE — H&P (Signed)
Tracy Lynn is a 30 y.o. female G2P0010 @ 41.2 wks  presenting for IOL for postdates. Denies any ROM, vag bleeding. GBS pos.  OB History    Gravida Para Term Preterm AB Living   2       1 0   SAB TAB Ectopic Multiple Live Births   1             Past Medical History:  Diagnosis Date  . Asthma    Past Surgical History:  Procedure Laterality Date  . NO PAST SURGERIES    . WISDOM TOOTH EXTRACTION     Family History: family history includes Cancer in her maternal grandmother; Diabetes in her paternal grandmother; Hypertension in her paternal grandmother. Social History:  reports that she quit smoking about 2 years ago. Her smoking use included Cigarettes. She smoked 0.50 packs per day. She has never used smokeless tobacco. She reports that she drinks alcohol. She reports that she uses drugs, including Marijuana, about 7 times per week.     Maternal Diabetes: No Genetic Screening: Normal Maternal Ultrasounds/Referrals: Normal Fetal Ultrasounds or other Referrals:  None Maternal Substance Abuse:  No Significant Maternal Medications:  None Significant Maternal Lab Results:  None Other Comments:  None  Review of Systems  Constitutional: Negative.   HENT: Negative.   Eyes: Negative.   Respiratory: Negative.   Cardiovascular: Negative.   Gastrointestinal: Negative.   Genitourinary: Negative.   Musculoskeletal: Negative.   Skin: Negative.   Neurological: Negative.   Endo/Heme/Allergies: Negative.   Psychiatric/Behavioral: Negative.    Maternal Medical History:  Reason for admission: postdates  Fetal activity: Perceived fetal activity is normal.   Last perceived fetal movement was within the past hour.    Prenatal complications: no prenatal complications Prenatal Complications - Diabetes: none.    Dilation: Fingertip Effacement (%): Thick Station: -3 Exam by:: A Ellis CNM Blood pressure 122/64, pulse (!) 101, temperature 97.7 F (36.5 C), temperature source Oral,  resp. rate 20, height 5' 5.5" (1.664 m), weight 289 lb (131.1 kg), last menstrual period 11/22/2014. Maternal Exam:  Abdomen: Patient reports no abdominal tenderness. Estimated fetal weight is 8-0.   Fetal presentation: vertex  Introitus: Normal vulva. Normal vagina.  Ferning test: not done.  Nitrazine test: not done. Amniotic fluid character: not assessed.  Pelvis: adequate for delivery.   Cervix: Cervix evaluated by digital exam.     Fetal Exam Fetal Monitor Review: Mode: ultrasound.   Baseline rate: 130's.  Variability: moderate (6-25 bpm).   Pattern: accelerations present and no decelerations.    Fetal State Assessment: Category I - tracings are normal.     Physical Exam  Constitutional: She is oriented to person, place, and time. She appears well-developed and well-nourished.  HENT:  Head: Normocephalic.  Eyes: Pupils are equal, round, and reactive to light.  Neck: Normal range of motion.  Cardiovascular: Normal rate, regular rhythm and normal heart sounds.   Respiratory: Effort normal and breath sounds normal.  GI: Soft. Bowel sounds are normal.  Genitourinary: Vagina normal and uterus normal.  Musculoskeletal: Normal range of motion.  Neurological: She is alert and oriented to person, place, and time. She has normal reflexes.  Skin: Skin is warm and dry.  Psychiatric: She has a normal mood and affect. Her behavior is normal. Judgment and thought content normal.    Prenatal labs: ABO, Rh: B/POS/-- (03/03 0925) Antibody: NEG (03/03 0925) Rubella: 1.52 (03/03 0925) RPR: NON REAC (05/25 1645)  HBsAg: NEGATIVE (03/03 0925)  HIV:  NONREACTIVE (05/25 1645)  GBS: Positive (08/18 0000)   Assessment/Plan: Pregnancy at 41.2wks, IOL at post dates, GBS pos, pt denies ever  Receiving PCN- only placed on allergy listed d/t maternal preference; will start Cytotec      Tracy Lynn 09/13/2015, 6:44 PM

## 2015-09-14 ENCOUNTER — Inpatient Hospital Stay (HOSPITAL_COMMUNITY): Payer: Medicaid Other | Admitting: Anesthesiology

## 2015-09-14 ENCOUNTER — Encounter (HOSPITAL_COMMUNITY)
Admission: RE | Disposition: A | Discharge status: Home / Self Care | Payer: Self-pay | Source: Ambulatory Visit | Attending: Obstetrics and Gynecology

## 2015-09-14 ENCOUNTER — Encounter (HOSPITAL_COMMUNITY): Payer: Self-pay

## 2015-09-14 DIAGNOSIS — O480 Post-term pregnancy: Secondary | ICD-10-CM

## 2015-09-14 DIAGNOSIS — Z3A41 41 weeks gestation of pregnancy: Secondary | ICD-10-CM

## 2015-09-14 DIAGNOSIS — O99824 Streptococcus B carrier state complicating childbirth: Secondary | ICD-10-CM

## 2015-09-14 LAB — CBC
HCT: 29.4 % — ABNORMAL LOW (ref 36.0–46.0)
Hemoglobin: 9.9 g/dL — ABNORMAL LOW (ref 12.0–15.0)
MCH: 29 pg (ref 26.0–34.0)
MCHC: 33.7 g/dL (ref 30.0–36.0)
MCV: 86.2 fL (ref 78.0–100.0)
PLATELETS: 131 10*3/uL — AB (ref 150–400)
RBC: 3.41 MIL/uL — ABNORMAL LOW (ref 3.87–5.11)
RDW: 15.1 % (ref 11.5–15.5)
WBC: 12.6 10*3/uL — AB (ref 4.0–10.5)

## 2015-09-14 LAB — ABO/RH: ABO/RH(D): B POS

## 2015-09-14 LAB — RPR: RPR Ser Ql: NONREACTIVE

## 2015-09-14 SURGERY — Surgical Case
Anesthesia: Regional

## 2015-09-14 MED ORDER — ONDANSETRON HCL 4 MG/2ML IJ SOLN
INTRAMUSCULAR | Status: AC
  Filled 2015-09-14: qty 2

## 2015-09-14 MED ORDER — DIPHENHYDRAMINE HCL 12.5 MG/5ML PO ELIX
12.5000 mg | ORAL_SOLUTION | Freq: Four times a day (QID) | ORAL | Status: DC | PRN
  Filled 2015-09-14: qty 5

## 2015-09-14 MED ORDER — NALOXONE HCL 0.4 MG/ML IJ SOLN: 0.4000 mg | INTRAMUSCULAR | Status: DC | PRN

## 2015-09-14 MED ORDER — ZOLPIDEM TARTRATE 5 MG PO TABS: 5.0000 mg | ORAL_TABLET | Freq: Every evening | ORAL | Status: DC | PRN

## 2015-09-14 MED ORDER — MENTHOL 3 MG MT LOZG
1.0000 | LOZENGE | OROMUCOSAL | Status: DC | PRN
  Administered 2015-09-15: 3 mg via ORAL
  Filled 2015-09-14: qty 9

## 2015-09-14 MED ORDER — FENTANYL CITRATE (PF) 100 MCG/2ML IJ SOLN
INTRAMUSCULAR | Status: AC
  Filled 2015-09-14: qty 2

## 2015-09-14 MED ORDER — LACTATED RINGERS IV SOLN
INTRAVENOUS | Status: DC | PRN
  Administered 2015-09-14: 01:00:00 via INTRAVENOUS

## 2015-09-14 MED ORDER — TERBUTALINE SULFATE 1 MG/ML IJ SOLN
INTRAMUSCULAR | Status: AC
  Filled 2015-09-14: qty 1

## 2015-09-14 MED ORDER — SIMETHICONE 80 MG PO CHEW: 80.0000 mg | CHEWABLE_TABLET | ORAL | Status: DC | PRN

## 2015-09-14 MED ORDER — WITCH HAZEL-GLYCERIN EX PADS: 1.0000 "application " | MEDICATED_PAD | CUTANEOUS | Status: DC | PRN

## 2015-09-14 MED ORDER — FENTANYL CITRATE (PF) 250 MCG/5ML IJ SOLN
INTRAMUSCULAR | Status: AC
  Filled 2015-09-14: qty 5

## 2015-09-14 MED ORDER — SENNOSIDES-DOCUSATE SODIUM 8.6-50 MG PO TABS
2.0000 | ORAL_TABLET | ORAL | Status: DC
  Administered 2015-09-15 – 2015-09-17 (×3): 2 via ORAL
  Filled 2015-09-14 (×3): qty 2

## 2015-09-14 MED ORDER — SODIUM CHLORIDE 0.9% FLUSH: 9.0000 mL | INTRAVENOUS | Status: DC | PRN

## 2015-09-14 MED ORDER — LACTATED RINGERS IV SOLN
INTRAVENOUS | Status: DC | PRN
  Administered 2015-09-14: 02:00:00 via INTRAVENOUS

## 2015-09-14 MED ORDER — MIDAZOLAM HCL 5 MG/5ML IJ SOLN
INTRAMUSCULAR | Status: DC | PRN
  Administered 2015-09-14: 2 mg via INTRAVENOUS

## 2015-09-14 MED ORDER — COCONUT OIL OIL
1.0000 "application " | TOPICAL_OIL | Status: DC | PRN
  Filled 2015-09-14: qty 120

## 2015-09-14 MED ORDER — DIBUCAINE 1 % RE OINT: 1.0000 "application " | TOPICAL_OINTMENT | RECTAL | Status: DC | PRN

## 2015-09-14 MED ORDER — DEXTROSE 5 % IV SOLN
INTRAVENOUS | Status: DC | PRN
  Administered 2015-09-14: 3 g via INTRAVENOUS

## 2015-09-14 MED ORDER — DIPHENHYDRAMINE HCL 50 MG/ML IJ SOLN: 12.5000 mg | Freq: Four times a day (QID) | INTRAMUSCULAR | Status: DC | PRN

## 2015-09-14 MED ORDER — FENTANYL CITRATE (PF) 250 MCG/5ML IJ SOLN
INTRAMUSCULAR | Status: DC | PRN
  Administered 2015-09-14: 100 ug via INTRAVENOUS
  Administered 2015-09-14: 250 ug via INTRAVENOUS
  Administered 2015-09-14: 50 ug via INTRAVENOUS
  Administered 2015-09-14: 100 ug via INTRAVENOUS
  Administered 2015-09-14 (×2): 50 ug via INTRAVENOUS

## 2015-09-14 MED ORDER — IBUPROFEN 600 MG PO TABS: 600.0000 mg | ORAL_TABLET | Freq: Four times a day (QID) | ORAL | Status: DC | PRN

## 2015-09-14 MED ORDER — SCOPOLAMINE 1 MG/3DAYS TD PT72
MEDICATED_PATCH | TRANSDERMAL | Status: AC
  Filled 2015-09-14: qty 1

## 2015-09-14 MED ORDER — KETOROLAC TROMETHAMINE 30 MG/ML IJ SOLN
INTRAMUSCULAR | Status: DC | PRN
  Administered 2015-09-14: 30 mg via INTRAVENOUS

## 2015-09-14 MED ORDER — LIDOCAINE HCL (CARDIAC) 20 MG/ML IV SOLN
INTRAVENOUS | Status: DC | PRN
  Administered 2015-09-14: 60 mg via INTRAVENOUS

## 2015-09-14 MED ORDER — HYDROMORPHONE 1 MG/ML IV SOLN: INTRAVENOUS | Status: DC

## 2015-09-14 MED ORDER — SIMETHICONE 80 MG PO CHEW
80.0000 mg | CHEWABLE_TABLET | ORAL | Status: DC
  Administered 2015-09-15 – 2015-09-17 (×3): 80 mg via ORAL
  Filled 2015-09-14 (×3): qty 1

## 2015-09-14 MED ORDER — SIMETHICONE 80 MG PO CHEW
80.0000 mg | CHEWABLE_TABLET | Freq: Three times a day (TID) | ORAL | Status: DC
  Administered 2015-09-14 – 2015-09-17 (×9): 80 mg via ORAL
  Filled 2015-09-14 (×9): qty 1

## 2015-09-14 MED ORDER — HYDROMORPHONE HCL 1 MG/ML IJ SOLN
INTRAMUSCULAR | Status: AC
  Filled 2015-09-14: qty 1

## 2015-09-14 MED ORDER — IBUPROFEN 600 MG PO TABS
600.0000 mg | ORAL_TABLET | Freq: Four times a day (QID) | ORAL | Status: DC
  Administered 2015-09-14 – 2015-09-17 (×14): 600 mg via ORAL
  Filled 2015-09-14 (×13): qty 1

## 2015-09-14 MED ORDER — TETANUS-DIPHTH-ACELL PERTUSSIS 5-2.5-18.5 LF-MCG/0.5 IM SUSP: 0.5000 mL | Freq: Once | INTRAMUSCULAR | Status: DC

## 2015-09-14 MED ORDER — HYDROMORPHONE HCL 1 MG/ML IJ SOLN
0.2500 mg | INTRAMUSCULAR | Status: DC | PRN
  Administered 2015-09-14 (×2): 0.5 mg via INTRAVENOUS
  Administered 2015-09-14 (×2): 0.25 mg via INTRAVENOUS

## 2015-09-14 MED ORDER — CEFAZOLIN SODIUM 10 G IJ SOLR
INTRAMUSCULAR | Status: AC
  Filled 2015-09-14: qty 3000

## 2015-09-14 MED ORDER — SCOPOLAMINE 1 MG/3DAYS TD PT72
MEDICATED_PATCH | TRANSDERMAL | Status: DC | PRN
  Administered 2015-09-14: 1 via TRANSDERMAL

## 2015-09-14 MED ORDER — LACTATED RINGERS IV SOLN: INTRAVENOUS | Status: DC

## 2015-09-14 MED ORDER — HYDROMORPHONE 1 MG/ML IV SOLN
INTRAVENOUS | Status: DC
  Administered 2015-09-14: 05:00:00 via INTRAVENOUS

## 2015-09-14 MED ORDER — DIPHENHYDRAMINE HCL 25 MG PO CAPS: 25.0000 mg | ORAL_CAPSULE | Freq: Four times a day (QID) | ORAL | Status: DC | PRN

## 2015-09-14 MED ORDER — KETOROLAC TROMETHAMINE 30 MG/ML IJ SOLN
INTRAMUSCULAR | Status: AC
  Filled 2015-09-14: qty 1

## 2015-09-14 MED ORDER — HYDROMORPHONE 1 MG/ML IV SOLN
INTRAVENOUS | Status: DC
  Filled 2015-09-14: qty 25

## 2015-09-14 MED ORDER — SUCCINYLCHOLINE CHLORIDE 200 MG/10ML IV SOSY
PREFILLED_SYRINGE | INTRAVENOUS | Status: DC | PRN
  Administered 2015-09-14: 120 mg via INTRAVENOUS

## 2015-09-14 MED ORDER — FENTANYL CITRATE (PF) 250 MCG/5ML IJ SOLN
INTRAMUSCULAR | Status: AC
  Filled 2015-09-14: qty 10

## 2015-09-14 MED ORDER — OXYTOCIN 40 UNITS IN LACTATED RINGERS INFUSION - SIMPLE MED
2.5000 [IU]/h | INTRAVENOUS | Status: AC
  Filled 2015-09-14: qty 1000

## 2015-09-14 MED ORDER — ONDANSETRON HCL 4 MG/2ML IJ SOLN: 4.0000 mg | Freq: Once | INTRAMUSCULAR | Status: DC | PRN

## 2015-09-14 MED ORDER — ONDANSETRON HCL 4 MG/2ML IJ SOLN: 4.0000 mg | Freq: Four times a day (QID) | INTRAMUSCULAR | Status: DC | PRN

## 2015-09-14 MED ORDER — PROPOFOL 10 MG/ML IV BOLUS
INTRAVENOUS | Status: AC
  Filled 2015-09-14: qty 20

## 2015-09-14 MED ORDER — DIPHENHYDRAMINE HCL 12.5 MG/5ML PO ELIX: 12.5000 mg | ORAL_SOLUTION | Freq: Four times a day (QID) | ORAL | Status: DC | PRN

## 2015-09-14 MED ORDER — PROPOFOL 10 MG/ML IV BOLUS
INTRAVENOUS | Status: DC | PRN
  Administered 2015-09-14: 200 mg via INTRAVENOUS

## 2015-09-14 MED ORDER — OXYCODONE-ACETAMINOPHEN 5-325 MG PO TABS
1.0000 | ORAL_TABLET | Freq: Four times a day (QID) | ORAL | Status: DC | PRN
  Administered 2015-09-14 – 2015-09-15 (×5): 2 via ORAL
  Administered 2015-09-15 (×2): 1 via ORAL
  Administered 2015-09-15 – 2015-09-17 (×5): 2 via ORAL
  Filled 2015-09-14 (×11): qty 2

## 2015-09-14 MED ORDER — ACETAMINOPHEN 325 MG PO TABS: 650.0000 mg | ORAL_TABLET | ORAL | Status: DC | PRN

## 2015-09-14 MED ORDER — MIDAZOLAM HCL 2 MG/2ML IJ SOLN
INTRAMUSCULAR | Status: AC
  Filled 2015-09-14: qty 2

## 2015-09-14 MED ORDER — PRENATAL MULTIVITAMIN CH
1.0000 | ORAL_TABLET | Freq: Every day | ORAL | Status: DC
  Administered 2015-09-15 – 2015-09-17 (×3): 1 via ORAL
  Filled 2015-09-14 (×3): qty 1

## 2015-09-14 MED ORDER — ONDANSETRON HCL 4 MG/2ML IJ SOLN
INTRAMUSCULAR | Status: DC | PRN
  Administered 2015-09-14: 4 mg via INTRAVENOUS

## 2015-09-14 MED ORDER — OXYTOCIN 10 UNIT/ML IJ SOLN
INTRAVENOUS | Status: DC | PRN
  Administered 2015-09-14: 40 [IU] via INTRAVENOUS

## 2015-09-14 MED ORDER — OXYTOCIN 10 UNIT/ML IJ SOLN
INTRAMUSCULAR | Status: AC
  Filled 2015-09-14: qty 4

## 2015-09-14 SURGICAL SUPPLY — 32 items
BRR ADH 6X5 SEPRAFILM 1 SHT (MISCELLANEOUS)
CHLORAPREP W/TINT 26ML (MISCELLANEOUS) ×3 IMPLANT
CLAMP CORD UMBIL (MISCELLANEOUS) IMPLANT
CONTAINER PREFILL 10% NBF 15ML (MISCELLANEOUS) IMPLANT
DRSG OPSITE POSTOP 4X10 (GAUZE/BANDAGES/DRESSINGS) ×3 IMPLANT
ELECT REM PT RETURN 9FT ADLT (ELECTROSURGICAL) ×3
ELECTRODE REM PT RTRN 9FT ADLT (ELECTROSURGICAL) ×1 IMPLANT
EXTRACTOR VACUUM M CUP 4 TUBE (SUCTIONS) IMPLANT
EXTRACTOR VACUUM M CUP 4' TUBE (SUCTIONS)
GLOVE BIOGEL PI IND STRL 6.5 (GLOVE) ×1 IMPLANT
GLOVE BIOGEL PI IND STRL 7.0 (GLOVE) ×1 IMPLANT
GLOVE BIOGEL PI INDICATOR 6.5 (GLOVE) ×2
GLOVE BIOGEL PI INDICATOR 7.0 (GLOVE) ×2
GLOVE SURG SS PI 6.0 STRL IVOR (GLOVE) ×3 IMPLANT
GOWN STRL REUS W/TWL LRG LVL3 (GOWN DISPOSABLE) ×6 IMPLANT
KIT ABG SYR 3ML LUER SLIP (SYRINGE) ×2 IMPLANT
NDL HYPO 25X5/8 SAFETYGLIDE (NEEDLE) IMPLANT
NEEDLE HYPO 25X5/8 SAFETYGLIDE (NEEDLE) ×3 IMPLANT
NS IRRIG 1000ML POUR BTL (IV SOLUTION) ×3 IMPLANT
PACK C SECTION WH (CUSTOM PROCEDURE TRAY) ×3 IMPLANT
PAD ABD 8X10 STRL (GAUZE/BANDAGES/DRESSINGS) ×2 IMPLANT
PAD OB MATERNITY 4.3X12.25 (PERSONAL CARE ITEMS) ×3 IMPLANT
PENCIL SMOKE EVAC W/HOLSTER (ELECTROSURGICAL) ×3 IMPLANT
RTRCTR C-SECT PINK 25CM LRG (MISCELLANEOUS) ×2 IMPLANT
SEPRAFILM MEMBRANE 5X6 (MISCELLANEOUS) IMPLANT
SPONGE GAUZE 4X4 12PLY (GAUZE/BANDAGES/DRESSINGS) ×2 IMPLANT
SUT PLAIN 0 NONE (SUTURE) IMPLANT
SUT VIC AB 0 CT1 36 (SUTURE) ×12 IMPLANT
SUT VIC AB 4-0 KS 27 (SUTURE) ×3 IMPLANT
TAPE CLOTH SURG 4X10 WHT LF (GAUZE/BANDAGES/DRESSINGS) ×2 IMPLANT
TOWEL OR 17X24 6PK STRL BLUE (TOWEL DISPOSABLE) ×3 IMPLANT
TRAY FOLEY CATH SILVER 14FR (SET/KITS/TRAYS/PACK) ×3 IMPLANT

## 2015-09-14 NOTE — Progress Notes (Signed)
[facility]  Delivery Note:  C-section       09/14/2015  1:41 AM  I was called to the operating room at the request of the patient's obstetrician (Dr. Jolayne Pantheronstant) for a primary c-section due to fetal distress.  PRENATAL HX:  This is a 30 y/o G2P0010 at 8341 and 3/[redacted] weeks gestation who was admitted yesterday for IOL due to post dates.  She is GBS positive but was not yet started on antibiotic treatment as she was never in active labor.  Delivery was by stat c-section for sudden onset prolonged fetal bradycardia to the 70s-90s.  AROM with clear fluid at delivery.   DELIVERY:  No obvious cause for fetal distress noted at delivery.  Infant had weak cry and poor tone at delivery, but HR was > 100.  Initially he required no resuscitation other than standard warming, drying and stimulation.  Tone and response improved.  APGARs 6 and 7.  He was still cyanotic at 5 minutes with O2 saturations in mid 70s.  Despite 100% blow by O2, oxygen saturations only rose to the low 80s.  Exam notable for pallor, tachypnea, and retractions.  Given increased work of breathing, CPAP applied and O2 saturations only improved to mid 80's despite 100% O2.  Will admit to NICU for respiratory support and further evaluation.    _____________________ Electronically Signed By: Maryan CharLindsey Novia Lansberry, MD Neonatologist

## 2015-09-14 NOTE — Progress Notes (Signed)
UR chart review completed.  

## 2015-09-14 NOTE — Lactation Note (Signed)
This note was copied from a baby's chart. Lactation Consultation Note   Initial consult with this mom of a term NICU baby, and mom, who was at the baby's bedside, at 7 hours post partum.   I showed mom how to hand express, and mom was able to express 3 ml's easily. I told mom I would see her later to go over pumping.  Patient Name: Tracy Haze BoydenJasmyne Hardgrove ZOXWR'UToday's Date: 09/14/2015     Maternal Data    Feeding Feeding Type: Bottle Fed - Formula Nipple Type: Slow - flow Length of feed: 7 min  LATCH Score/Interventions                      Lactation Tools Discussed/Used     Consult Status      Alfred LevinsLee, Pink Maye Anne 09/14/2015, 1:56 PM

## 2015-09-14 NOTE — Op Note (Addendum)
Tracy Lynn PROCEDURE DATE: 09/13/2015 - 09/14/2015  PREOPERATIVE DIAGNOSIS: Intrauterine pregnancy at  6221w3d weeks gestation; non-reassuring fetal status  POSTOPERATIVE DIAGNOSIS: The same  PROCEDURE:     Cesarean Section  SURGEON:  Dr. Gigi GinPeggy Bradie Lacock  ASSISTANT: none  INDICATIONS: Tracy BoydenJasmyne Lynn is a 30 y.o. G2P0010 at 3821w3d scheduled for cesarean section secondary to non-reassuring fetal status.  The risks of cesarean section discussed with the patient briefly, as this was an emergency, included but were not limited to: bleeding which may require transfusion or reoperation; infection which may require antibiotics; injury to bowel, bladder, ureters or other surrounding organs; injury to the fetus; need for additional procedures including hysterectomy in the event of a life-threatening hemorrhage; placental abnormalities wth subsequent pregnancies, incisional problems, thromboembolic phenomenon and other postoperative/anesthesia complications. The patient concurred with the proposed plan, giving informed written consent for the procedure.    FINDINGS:  Viable female infant in cephalic presentation with APGARS 6 and 7 and weight of 6 lb 7 ounces.  Minimal clear amniotic fluid.  Intact placenta, three vessel cord. Very short cord. Cord was blanched and no cord gas or cord blood could be obtained.  Normal uterus, fallopian tubes and ovaries bilaterally.  ANESTHESIA:    General ET INTRAVENOUS FLUIDS:1500 ml ESTIMATED BLOOD LOSS: 700 ml URINE OUTPUT:  200 ml SPECIMENS: Placenta sent to pathology COMPLICATIONS: None immediate  PROCEDURE IN DETAIL:  The patient received intravenous antibiotics and had sequential compression devices applied to her lower extremities while in the preoperative area.  She was then taken to the operating room where anesthesia was induced and was found to be adequate. A foley catheter was placed into her bladder and attached to Joleen Stuckert gravity. She was then placed in a  dorsal supine position with a leftward tilt, and prepped and draped in a sterile manner. After an adequate timeout was performed, a Pfannenstiel skin incision was made with scalpel and carried through to the underlying layer of fascia. The fascia was incised in the midline and this incision was extended bilaterally using the Mayo scissors. Kocher clamps were applied to the superior aspect of the fascial incision and the underlying rectus muscles were dissected off bluntly. A similar process was carried out on the inferior aspect of the facial incision. The rectus muscles were separated in the midline bluntly and the peritoneum was entered bluntly. The Alexis self-retaining retractor was introduced into the abdominal cavity. Attention was turned to the lower uterine segment where a transverse hysterotomy was made with a scalpel and extended bilaterally bluntly. The infant was successfully delivered, and cord was clamped and cut and infant was handed over to awaiting neonatology team. Uterine massage was then administered and the placenta delivered intact with three-vessel cord. The uterus was cleared of clot and debris.  The hysterotomy was closed with 0 Vicryl in a running locked fashion, and an imbricating layer was also placed with a 0 Vicryl. Overall, excellent hemostasis was noted. The pelvis copiously irrigated and cleared of all clot and debris. Hemostasis was confirmed on all surfaces.  The peritoneum and the muscles were reapproximated using 0 vicryl interrupted stitches. The fascia was then closed using 0 Vicryl in a running fashion.  The skin was closed in a subcuticular fashion using 3.0 Vicryl. The patient tolerated the procedure well. Sponge, lap, instrument and needle counts were correct x 2. She was taken to the recovery room in stable condition.    Oral Hallgren,PEGGYMD  09/14/2015 1:54 AM

## 2015-09-14 NOTE — Addendum Note (Signed)
Addendum  created 09/14/15 1236 by Shanon PayorSuzanne M Ceanna Wareing, CRNA   Sign clinical note

## 2015-09-14 NOTE — Progress Notes (Addendum)
Patient screened out for psychosocial assessment since none of the following apply:  Psychosocial stressors documented in mother or baby's chart  Gestation less than 32 weeks  Code at delivery   Infant with anomalies  MOB and family voice no needs at this time. Available if psycho-social needs arise or emotional support is needed. Please contact the Clinical Social Worker if specific needs arise, or by MOB's request.   MOB requested a list of local pediatricians.  CSW informed MOB's bedside nurse of MOB's request.  Bedside nurse will provide a list to MOB  Evalie Hargraves Boyd-Gilyard, MSW, LCSW Clinical Social Work [phone] 

## 2015-09-14 NOTE — Lactation Note (Signed)
This note was copied from a baby's chart. Lactation Consultation Note  Patient Name: Tracy Haze BoydenJasmyne Goto ZOXWR'UToday's Date: 09/14/2015 Reason for consult: Follow-up assessment  With this mom of a NICU baby, now 4410 hours old. I showed mom how to use DEP. I fitted her with 24 flanges, despite large nipples. The fit was comfortable for mom, but I told her pumping should not hurt, and if she felt pain or discomfort, to increase to 27 flanges. NICU booklet reviewed, and hand expression done after pumping. I was able to express 11 ml's of colostrum after mom pumped. Mom will pump every 3 hours, but I did tel her to try and sleep today, as needed. Mom encouraged to do skin too skin with her baby, and is aware that breastfeeding assistance will be given to her and baby in NICU. O/P lactation appointment at baby's discharge recommended. Mom knows to call for questions/concerns.    Maternal Data    Feeding Feeding Type: Bottle Fed - Formula Nipple Type: Slow - flow Length of feed: 7 min  LATCH Score/Interventions                      Lactation Tools Discussed/Used     Consult Status      Alfred LevinsLee, Dominga Mcduffie Anne 09/14/2015, 2:00 PM

## 2015-09-14 NOTE — Anesthesia Postprocedure Evaluation (Signed)
Anesthesia Post Note  Patient: Tracy Lynn  Procedure(s) Performed: Procedure(s) (LRB): CESAREAN SECTION (N/A)  Patient location during evaluation: PACU Anesthesia Type: General Level of consciousness: awake and alert Pain management: pain level controlled Vital Signs Assessment: post-procedure vital signs reviewed and stable Respiratory status: spontaneous breathing, nonlabored ventilation and respiratory function stable Cardiovascular status: blood pressure returned to baseline and stable Postop Assessment: no signs of nausea or vomiting Anesthetic complications: no     Last Vitals:  Vitals:   09/14/15 0230 09/14/15 0245  BP: (!) 142/95 133/77  Pulse: 100 99  Resp: 15 15  Temp:      Last Pain:  Vitals:   09/14/15 0245  TempSrc:   PainSc: 4    Pain Goal: Patients Stated Pain Goal: 4 (09/14/15 0245)               Linton RumpJennifer Dickerson Thersia Petraglia

## 2015-09-14 NOTE — Anesthesia Postprocedure Evaluation (Signed)
Anesthesia Post Note  Patient: Tracy Lynn  Procedure(s) Performed: Procedure(s) (LRB): CESAREAN SECTION (N/A)  Patient location during evaluation: Mother Baby Anesthesia Type: General Level of consciousness: awake and alert and oriented Pain management: pain level controlled Vital Signs Assessment: post-procedure vital signs reviewed and stable Respiratory status: spontaneous breathing Cardiovascular status: stable Postop Assessment: no headache, no backache, epidural receding, spinal receding, patient able to bend at knees, no signs of nausea or vomiting and adequate PO intake Anesthetic complications: no     Last Vitals:  Vitals:   09/14/15 0730 09/14/15 1130  BP: (!) 148/88 130/77  Pulse: 88 89  Resp: 18 18  Temp:  36.6 C    Last Pain:  Vitals:   09/14/15 1135  TempSrc:   PainSc: 6    Pain Goal: Patients Stated Pain Goal: 4 (09/14/15 0400)               Madison HickmanGREGORY,Brondon Wann

## 2015-09-14 NOTE — Anesthesia Preprocedure Evaluation (Signed)
Anesthesia Evaluation  Patient identified by MRN, date of birth, ID band Patient awake    Reviewed: Unable to perform ROS - Chart review onlyPreop documentation limited or incomplete due to emergent nature of procedure.  Airway Mallampati: III       Dental   Pulmonary asthma , former smoker,    Pulmonary exam normal breath sounds clear to auscultation       Cardiovascular  Rhythm:Regular Rate:Normal     Neuro/Psych    GI/Hepatic   Endo/Other  Morbid obesity  Renal/GU      Musculoskeletal   Abdominal (+) + obese,   Peds  Hematology   Anesthesia Other Findings   Reproductive/Obstetrics (+) Pregnancy                             Anesthesia Physical Anesthesia Plan  ASA: III and emergent  Anesthesia Plan: General   Post-op Pain Management:    Induction: Intravenous, Rapid sequence and Cricoid pressure planned  Airway Management Planned: Oral ETT  Additional Equipment:   Intra-op Plan:   Post-operative Plan: Extubation in OR  Informed Consent:   Plan Discussed with:   Anesthesia Plan Comments: (Due to emergent nature of procedure, consent not reviewed. Operating under emergent consent.)        Anesthesia Quick Evaluation

## 2015-09-14 NOTE — Lactation Note (Signed)
This note was copied from a baby's chart. Lactation Consultation Note  Patient Name: Tracy Lynn XBMWU'XToday's Date: 09/14/2015 Reason for consult: Follow-up assessment with this mom and term baby, in NICU, now 1114 hours old. The baby is no longer on oxygen, and is breastfeeding for the first time. Mom has large nipples, but the baby ws able to open mouth wide, with both lips flanges, nand latched beyond nipples, with strong, rhythmic suckles, and intermittent visible swallows. He fed for 25 minutes, and then had labs drawn. Basic breas feeding teaching done with mom. Mom teary eyed, very happy/emotional. Mom to try and breastfeed at feeding times, and then post pump.    Maternal Data    Feeding Feeding Type: Breast Fed Nipple Type: Slow - flow Length of feed: 7 min  LATCH Score/Interventions Latch: Grasps breast easily, tongue down, lips flanged, rhythmical sucking.  Audible Swallowing: A few with stimulation  Type of Nipple: Everted at rest and after stimulation  Comfort (Breast/Nipple): Soft / non-tender     Hold (Positioning): Assistance needed to correctly position infant at breast and maintain latch. Intervention(s): Breastfeeding basics reviewed;Support Pillows;Position options;Skin to skin  LATCH Score: 8  Lactation Tools Discussed/Used     Consult Status Consult Status: Follow-up Date: 09/15/15 Follow-up type: In-patient    Alfred LevinsLee, Dehlia Kilner Anne 09/14/2015, 3:26 PM

## 2015-09-14 NOTE — Anesthesia Procedure Notes (Signed)
Procedure Name: Intubation Date/Time: 09/14/2015 1:14 AM Performed by: Renford DillsMULLINS, Huntley Demedeiros L Pre-anesthesia Checklist: Patient identified, Emergency Drugs available, Suction available, Patient being monitored and Timeout performed Patient Re-evaluated:Patient Re-evaluated prior to inductionOxygen Delivery Method: Circle system utilized Preoxygenation: Pre-oxygenation with 100% oxygen Intubation Type: IV induction, Rapid sequence and Cricoid Pressure applied Laryngoscope Size: Glidescope Grade View: Grade II Tube type: Oral Tube size: 7.0 mm Number of attempts: 1 Airway Equipment and Method: Stylet and Video-laryngoscopy Placement Confirmation: ETT inserted through vocal cords under direct vision,  positive ETCO2 and breath sounds checked- equal and bilateral Secured at: 21 cm Tube secured with: Tape Dental Injury: Teeth and Oropharynx as per pre-operative assessment

## 2015-09-14 NOTE — Progress Notes (Signed)
FHR baseline was 135 with accelerations, patient went to the bathroom to urinate; placed pt back on monitors when she returned at 0037 and FHR was in the 70's; notified provider.

## 2015-09-14 NOTE — Anesthesia Procedure Notes (Signed)
Performed by: Khalil Szczepanik L       

## 2015-09-14 NOTE — OB Triage Provider Note (Signed)
O: Called to room for prolonged decel to 70's-90's after pt up tp BR. Resolution but continues to have decels.  A; non reassurring FHR pattern P: Dr. Jolayne Pantheronstant call ed to evaluate strip

## 2015-09-14 NOTE — Transfer of Care (Signed)
Immediate Anesthesia Transfer of Care Note  Patient: Tracy Lynn  Procedure(s) Performed: Procedure(s): CESAREAN SECTION (N/A)  Patient Location: PACU  Anesthesia Type:General  Level of Consciousness: awake  Airway & Oxygen Therapy: Patient Spontanous Breathing and Patient connected to nasal cannula oxygen  Post-op Assessment: Report given to RN and Post -op Vital signs reviewed and stable  Post vital signs: stable  Last Vitals:  Vitals:   09/14/15 0005 09/14/15 0105  BP: 113/78 (!) 104/44  Pulse: 87 (!) 120  Resp: 16   Temp:      Last Pain:  Vitals:   09/13/15 2345  TempSrc: Oral  PainSc:       Patients Stated Pain Goal: 6 (09/13/15 2115)  Complications: No apparent anesthesia complications

## 2015-09-15 NOTE — Discharge Instructions (Signed)
Cesarean Delivery, Care After  Refer to this sheet in the next few weeks. These instructions provide you with information on caring for yourself after your procedure. Your health care provider may also give you specific instructions. Your treatment has been planned according to current medical practices, but problems sometimes occur. Call your health care provider if you have any problems or questions after you go home.  HOME CARE INSTRUCTIONS   Only take over-the-counter or prescription medications as directed by your health care provider.   Do not drink alcohol, especially if you are breastfeeding or taking medication to relieve pain.   Do not chew or smoke tobacco.   Continue to use good perineal care. Good perineal care includes:    Wiping your perineum from front to back.    Keeping your perineum clean.   Check your surgical cut (incision) daily for increased redness, drainage, swelling, or separation of skin.   Clean your incision gently with soap and water every day, and then pat it dry. If your health care provider says it is okay, leave the incision uncovered. Use a bandage (dressing) if the incision is draining fluid or appears irritated. If the adhesive strips across the incision do not fall off within 7 days, carefully peel them off.   Hug a pillow when coughing or sneezing until your incision is healed. This helps to relieve pain.   Do not use tampons or douche until your health care provider says it is okay.   Shower, wash your hair, and take tub baths as directed by your health care provider.   Wear a well-fitting bra that provides breast support.   Limit wearing support panties or control-top hose.   Drink enough fluids to keep your urine clear or pale yellow.   Eat high-fiber foods such as whole grain cereals and breads, brown rice, beans, and fresh fruits and vegetables every day. These foods may help prevent or relieve constipation.   Resume activities such as climbing stairs,  driving, lifting, exercising, or traveling as directed by your health care provider.   Talk to your health care provider about resuming sexual activities. This is dependent upon your risk of infection, your rate of healing, and your comfort and desire to resume sexual activity.   Try to have someone help you with your household activities and your newborn for at least a few days after you leave the hospital.   Rest as much as possible. Try to rest or take a nap when your newborn is sleeping.   Increase your activities gradually.   Keep all of your scheduled postpartum appointments. It is very important to keep your scheduled follow-up appointments. At these appointments, your health care provider will be checking to make sure that you are healing physically and emotionally.  SEEK MEDICAL CARE IF:    You are passing large clots from your vagina. Save any clots to show your health care provider.   You have a foul smelling discharge from your vagina.   You have trouble urinating.   You are urinating frequently.   You have pain when you urinate.   You have a change in your bowel movements.   You have increasing redness, pain, or swelling near your incision.   You have pus draining from your incision.   Your incision is separating.   You have painful, hard, or reddened breasts.   You have a severe headache.   You have blurred vision or see spots.   You feel sad   or depressed.   You have thoughts of hurting yourself or your newborn.   You have questions about your care, the care of your newborn, or medications.   You are dizzy or light-headed.   You have a rash.   You have pain, redness, or swelling at the site of the removed intravenous access (IV) tube.   You have nausea or vomiting.   You stopped breastfeeding and have not had a menstrual period within 12 weeks of stopping.   You are not breastfeeding and have not had a menstrual period within 12 weeks of delivery.   You have a fever.  SEEK  IMMEDIATE MEDICAL CARE IF:   You have persistent pain.   You have chest pain.   You have shortness of breath.   You faint.   You have leg pain.   You have stomach pain.   Your vaginal bleeding saturates 2 or more sanitary pads in 1 hour.  MAKE SURE YOU:    Understand these instructions.   Will watch your condition.   Will get help right away if you are not doing well or get worse.     This information is not intended to replace advice given to you by your health care provider. Make sure you discuss any questions you have with your health care provider.     Document Released: 09/25/2001 Document Revised: 01/24/2014 Document Reviewed: 08/31/2011  Elsevier Interactive Patient Education 2016 [employer]

## 2015-09-15 NOTE — Lactation Note (Signed)
This note was copied from a baby's chart. Lactation Consultation Note  Patient Name: Boy Dezeray Puccio KDPTE'L Date: 09/15/2015   NICU baby 60 hours old. Mom called for assistance with using the DEBP. Demonstrated how to assemble, use and clean pump parts. Enc mom to pump for 15 minutes, 8 times/24 hours, followed by hand expression. Mom reported that she had some issue with hand expressing into small colostrum container. Enc mom to use a rolled up washcloth to support her breast and raise her nipples where she can see them. Gave mom the small bottle from her kit with a wider opening to hand express into as well.   Mom reports that the baby nursed well again in the NICU at the last feeding. Mom states that the baby should be back to the room in an hours or so. Enc mom to put baby to breast first with each feeding, and then use the DEBP afterwards if the baby not nursing well at the breast.    Maternal Data    Feeding Feeding Type: Formula Nipple Type: Slow - flow Length of feed: 10 min  LATCH Score/Interventions                      Lactation Tools Discussed/Used     Consult Status      Andres Labrum 09/15/2015, 4:20 PM

## 2015-09-15 NOTE — Lactation Note (Signed)
This note was copied from a baby's chart. Lactation Consultation Note  Patient Name: Boy Haze BoydenJasmyne Lank ZOXWR'UToday's Date: 09/15/2015 Reason for consult: Follow-up assessment;Difficult latch  NICU baby 3534 hours old. Called to NICU to assist with latching baby to right breast in football position. Baby latched deeply and suckled in bursts, with stimulation, and a few swallows were noted. Mom stated that baby had not wanted to nurse the last few attempts. Discussed newborn behavior in NICU. Mom also reports that she has been able to pump over 10 ml of EBM with her manual pump. Discussed the importance of using DEBP at bedside. Mom requesting additional help with the pump. [facility] to [facility] room. Baby's NICU nurse, Verlon AuLeslie, RN, reports that the baby may be returning to mom's room later today. Enc mom to call for assistance with latching as needed.  Discussed importance of having a hospital-grade pump if baby not willing to nurse regularly at breast. Mom states that she will call WIC today to see about getting a pump. [facility] and [facility], and that [facility].   Maternal Data    Feeding Feeding Type: Breast Fed Nipple Type: Slow - flow Length of feed:  (LC assessed first 10 minutes of BF.)  LATCH Score/Interventions Latch: Grasps breast easily, tongue down, lips flanged, rhythmical sucking. Intervention(s): Adjust position;Assist with latch;Breast compression  Audible Swallowing: A few with stimulation Intervention(s): Skin to skin;Hand expression  Type of Nipple: Everted at rest and after stimulation  Comfort (Breast/Nipple): Soft / non-tender     Hold (Positioning): Assistance needed to correctly position infant at breast and maintain latch. Intervention(s): Breastfeeding basics reviewed;Support Pillows;Position options;Skin to skin  LATCH Score: 8  Lactation Tools Discussed/Used     Consult  Status Consult Status: Follow-up Date: 09/16/15 Follow-up type: In-patient    Sherlyn HayJennifer D Lon Klippel 09/15/2015, 11:20 AM

## 2015-09-15 NOTE — Progress Notes (Signed)
Subjective: Postoperative Day 1: Cesarean Delivery with general anesthesia  Patient reports incisional pain, tolerating PO and no problems voiding.  Has not had bowel movement or had flatus.  Objective: Vital signs in last 24 hours: Temp:  [97.8 F (36.6 C)-98.4 F (36.9 C)] 98 F (36.7 C) (08/29 0536) Pulse Rate:  [84-89] 84 (08/29 0536) Resp:  [18] 18 (08/29 0536) BP: (125-142)/(70-78) 125/70 (08/29 0536) SpO2:  [98 %-99 %] 98 % (08/29 0043)  Physical Exam:  General: alert, cooperative and no distress Lochia: appropriate Uterine Fundus: firm Incision: no dehiscence, no significant erythema, dressing intact with some slight serosanguineous drainage DVT Evaluation: No evidence of DVT seen on physical exam. Negative Homan's sign.   Recent Labs  09/13/15 1815 09/14/15 1154  HGB 12.1 9.9*  HCT 34.4* 29.4*    Assessment/Plan: Status post Cesarean section. Doing well postoperatively.  Continue current care.  [name] PGY-1 09/15/2015, 7:42 AM

## 2015-09-16 NOTE — Clinical Social Work Maternal (Signed)
  CLINICAL SOCIAL WORK MATERNAL/CHILD NOTE  Patient Details  Name: Tracy Lynn MRN: [id number] Date of Birth: 15-May-1985  Date:  09/16/2015  Clinical Social Worker Initiating Note:  Laurey Arrow Date/ Time Initiated:  09/16/15/1151     Child's Name:  Sander Nephew   Legal Guardian:  Mother   Need for Interpreter:  None   Date of Referral:  09/16/15     Reason for Referral:  Current Substance Use/Substance Use During Pregnancy    Referral Source:  [facility]   Address:  [address] [place] [address]  Phone number:  [phone]   Household Members:  Self, Siblings, Parents   Natural Supports (not living in the home):  Immediate Family, Parent, Friends   Chiropodist: None   Employment: Animator   Type of Work: Scientist, forensic   Education:  Engineer, maintenance Resources:  Medicaid   Other Resources:  Physicist, medical , [place] Considerations Which May Impact Care:  None Reported  Strengths:  Ability to meet basic needs , Home prepared for child    Risk Factors/Current Problems:  Substance Use    Cognitive State:  Alert , Linear Thinking , Goal Oriented , Insightful    Mood/Affect:  Happy , Calm , Interested , Comfortable , Relaxed    CSW Assessment: CSW met with MOB to review hopital policy regarding substance use during pregnancy.  MOB was inviting, polite, and  interested with meeting with CSW.  CSW inquired about MOB's substance use.  MOB reported marijuana and alcohol use regularly prior to pregnancy confirmation. MOB also reported the use of 1 edible during the month of May and a glass of wine in July.  MOB denied any other alcohol and substance use.  CSW informed MOB of the hospital's drug screen policy.  CSW made MOB aware that CSW will monitor the infant's cordscreen, and will make a report to [place] if needed. MOB was understanding of the hospital policy and did not have any questions or  concerns. CSW educated MOB about PPD.  CSW informed MOB of supports and interventions to decrease PPD.  CSW also encouraged MOB to seek medical attention if needed for increased signs and symptoms for PPD.  CSW also reviewed safe sleep, and SIDS. MOB was knowledgeable.  MOB communicated that she has a bassinet for the baby and is aware of safe sleep interventions. CSW provided MOB with resource information for Healthy Start and encouraged MOB to make contact. CSW thanked MOB for her willingness to meet with her.  MOB did not have any further questions, concerns, or needs at this time.     CSW Plan/Description:  No Further Intervention Required/No Barriers to Discharge, Information/Referral to [employer] , Patient/Family Education  (CSW will follow infant's cord and will make a report if warranted. )   Laurey Arrow, MSW, LCSW Clinical Social Work [phone]   Dimple Nanas, LCSW 09/16/2015, 11:58 AM

## 2015-09-16 NOTE — Progress Notes (Signed)
Subjective: Postoperative Day 2: Cesarean Delivery Patient reports tolerating PO, + flatus, + BM and no problems voiding.    Objective: Vital signs in last 24 hours: Temp:  [98.3 F (36.8 C)] 98.3 F (36.8 C) (08/30 0600) Pulse Rate:  [72] 72 (08/30 0600) Resp:  [18] 18 (08/30 0600) BP: (121)/(61) 121/61 (08/30 0600)  Physical Exam:  General: alert, cooperative and no distress Lochia: appropriate Uterine Fundus: firm Incision: healing well, no significant drainage, no significant erythema, dressing intact with small dried area of serosanguineous drainage DVT Evaluation: No evidence of DVT seen on physical exam. Negative Homan's sign.   Recent Labs  09/13/15 1815 09/14/15 1154  HGB 12.1 9.9*  HCT 34.4* 29.4*    Assessment/Plan: Status post Cesarean section. Doing well postoperatively.  Continue current care.  Leland HerElsia J Yoo PGY-1 09/16/2015, 7:41 AM  OB FELLOW POSTPARTUM PROGRESS NOTE ATTESTATION  I have seen and examined this patient and agree with above documentation in the resident's note.   Ernestina PennaNicholas Schenk, MD 7:47 AM

## 2015-09-16 NOTE — Progress Notes (Signed)
CSW attempted to meet with MOB to complete an assessment for hx of substance use.  When CSW arrived, MOB was asleep.  MOB requested CSW to return at a later time.  CSW recognized infant asleep in MOB's arm while MOB was asleep. CSW took the opportunity to speak with MOB about SIDS and expressed concerns about co-sleeping.  With MOB's permission, CSW placed infant in bassinet and informed MOB that CSW will return at a later time.  Captola Teschner Boyd-Gilyard, MSW, LCSW Clinical Social Work [phone] 

## 2015-09-17 MED ORDER — FUROSEMIDE 20 MG PO TABS: 20.0000 mg | ORAL_TABLET | Freq: Every day | ORAL | 0 refills | Status: DC

## 2015-09-17 MED ORDER — OXYCODONE-ACETAMINOPHEN 5-325 MG PO TABS: 1.0000 | ORAL_TABLET | Freq: Four times a day (QID) | ORAL | 0 refills | Status: DC | PRN

## 2015-09-17 MED ORDER — FUROSEMIDE 20 MG PO TABS
20.0000 mg | ORAL_TABLET | Freq: Once | ORAL | Status: AC
  Administered 2015-09-17: 20 mg via ORAL
  Filled 2015-09-17: qty 1

## 2015-09-17 MED ORDER — IBUPROFEN 600 MG PO TABS: 600.0000 mg | ORAL_TABLET | Freq: Four times a day (QID) | ORAL | 0 refills | Status: DC

## 2015-09-17 MED ORDER — SENNOSIDES-DOCUSATE SODIUM 8.6-50 MG PO TABS: 2.0000 | ORAL_TABLET | ORAL | 0 refills | Status: DC

## 2015-09-17 NOTE — Lactation Note (Addendum)
This note was copied from a baby's chart. Lactation Consultation Note  Patient Name: Tracy Lynn UMPNT'I Date: 09/17/2015 Reason for consult: Follow-up assessment  Mom is putting infant to breast & then bottle-feeding EBM. When I entered room, she was offering a bottle. When "Bean" finished the bottle, she began to put a pacifier in his mouth. I encouraged her to wait a moment to see if he was still hungry & he began showing additional feeding cues. I assisted her with getting a deep latch and teaching her the sound of swallows. There were intermittent swallows at the breast, but infant began to get fussy, most likely b/c he was not getting the ready volume that he is used to with bottle-feeding. Paced bottle-feeding taught & Bean was content after feeding. Mom expressed a concern about having been cautioned about feeding her infant too much when he was in the NICU. I explained to Mom that if she is using a bottle-feeding method like the one used and giving him breaks to burp, then it is OK to feed him to satiety. Mom states that in retrospect, some prior fussiness was due to hunger.   Mom has a [employer] hand pump & knows how to use the hand pump that was included in her pump kit. Mom made aware that milk storage guidelines are more lenient when infant is no longer in the NICU.   Mom will meet w/Barb Carder, IBCLC on Tuesday.  Note: Mom's legs are incredibly edematous. MD is prescribing Lasix po for a couple of days to make Mom more comfortable.  Matthias Hughs [facility] 09/17/2015, 10:54 AM

## 2015-09-17 NOTE — Discharge Summary (Signed)
OB Discharge Summary     Patient Name: Tracy Lynn DOB: September 18, 1985 MRN: [id number]  Date of admission: 09/13/2015 Delivering MD: Catalina Antigua   Date of discharge: 09/17/2015  Admitting diagnosis: INDUCTION Intrauterine pregnancy: [redacted]w[redacted]d     Secondary diagnosis:  Active Problems:   Post-dates pregnancy      Discharge diagnosis: Term Pregnancy Delivered by [facility]CS with general anesthesia 2/2 to [facility]                                                                                                 Post partum procedures:none   Augmentation: Cytotec  Complications: None  Hospital course:  Induction of Labor With Cesarean Section  30 y.o. yo G2P1010 at [redacted]w[redacted]d was admitted to the hospital 09/13/2015 for induction of labor. Patient had a labor course significant for NRFHR. The patient went for cesarean section due to Non-Reassuring FHR, and delivered a Viable infant,@BABYSUPPRESS (DBLINK,ept,110,,1,,) Membrane Rupture Time/Date: )  ,    @Details  of operation can be found in separate operative Note.  Patient had an uncomplicated postpartum course. She is ambulating, tolerating a regular diet, passing flatus, and urinating well.  Patient is discharged home in stable condition on 09/17/15.                                     Physical exam  Vitals:   09/15/15 0536 09/16/15 0600 09/16/15 1800 09/17/15 0500  BP: 125/70 121/61 (!) 141/80 (!) 138/55  Pulse: 84 72 (!) 104 (!) 106  Resp: 18 18 18 18   Temp: 98 F (36.7 C) 98.3 F (36.8 C) 98 F (36.7 C) 98.4 F (36.9 C)  TempSrc: Oral Oral Oral Oral  SpO2:      Weight:      Height:       General: alert and cooperative Lochia: appropriate Uterine Fundus: firm Incision: Dressing is clean, dry, and intact DVT Evaluation: No evidence of DVT seen on physical exam. Calf/Ankle edema is present Labs: Lab Results  Component Value Date   WBC 12.6 (H) 09/14/2015   HGB 9.9 (L) 09/14/2015   HCT 29.4 (L) 09/14/2015   MCV 86.2 09/14/2015   PLT  131 (L) 09/14/2015   CMP Latest Ref Rng & Units 06/16/2015  Glucose 65 - 104 mg/dL 79  BUN 6 - 23 mg/dL -  Creatinine 4.09 - 8.11 mg/dL -  Sodium 914 - 782 mEq/L -  Potassium 3.5 - 5.1 mEq/L -  Chloride 96 - 112 mEq/L -  CO2 19 - 32 mEq/L -  Calcium 8.4 - 10.5 mg/dL -  Total Protein 6.0 - 8.3 g/dL -  Total Bilirubin 0.3 - 1.2 mg/dL -  Alkaline Phos 39 - 956 U/L -  AST 0 - 37 U/L -  ALT 0 - 35 U/L -    Discharge instruction: per After Visit Summary and "Baby and Me Booklet".  After visit meds:    Medication List    TAKE these medications   furosemide 20 MG tablet Commonly known as:  LASIX  Take 1 tablet (20 mg total) by mouth daily. Starting 9/1   ibuprofen 600 MG tablet Commonly known as:  ADVIL,MOTRIN Take 1 tablet (600 mg total) by mouth every 6 (six) hours.   multivitamin-prenatal 27-0.8 MG Tabs tablet Take 1 tablet by mouth daily at 12 noon.   oxyCODONE-acetaminophen 5-325 MG tablet Commonly known as:  PERCOCET/ROXICET Take 1-2 tablets by mouth every 6 (six) hours as needed for moderate pain or severe pain.   senna-docusate 8.6-50 MG tablet Commonly known as:  Senokot-S Take 2 tablets by mouth daily.       Diet: routine diet  Activity: Advance as tolerated. Pelvic rest for 6 weeks.   Outpatient follow up:6 weeks Follow up Appt:No future appointments. Follow up Visit:No Follow-up on file.  Postpartum contraception: IUD Mirena  Newborn Data: Live born female  Birth Weight: 6 lb 7.4 oz (2930 g) APGAR: 6, 7  Baby Feeding: Breast Disposition:home with mother   09/17/2015 De Hollingsheadatherine L Wallace, DO    OB FELLOW DISCHARGE ATTESTATION  I have seen and examined this patient and agree with above documentation in the resident's note.   Jen MowElizabeth Ezeriah Luty, DO OB Fellow 11:31 PM

## 2015-09-28 ENCOUNTER — Encounter: Payer: Self-pay | Admitting: Obstetrics & Gynecology

## 2015-09-28 ENCOUNTER — Ambulatory Visit (INDEPENDENT_AMBULATORY_CARE_PROVIDER_SITE_OTHER): Payer: Medicaid Other | Admitting: Obstetrics & Gynecology

## 2015-09-28 NOTE — Progress Notes (Signed)
Pt presents for wound check.  No problems with incision.  Ambulating well.  Vitals:   09/28/15 1356  BP: 125/68  Pulse: (!) 103  Weight: 261 lb (118.4 kg)  Height: 5' 6.5" (1.689 m)   Incision is well healed.  No evidence to infection or problems. Pt encouraged to continue breast feeding  RTC 4 q weeks

## 2015-10-26 ENCOUNTER — Ambulatory Visit: Payer: Medicaid Other | Admitting: Obstetrics & Gynecology

## 2015-10-26 ENCOUNTER — Telehealth: Payer: Self-pay | Admitting: *Deleted

## 2015-10-26 ENCOUNTER — Encounter: Payer: Self-pay | Admitting: *Deleted

## 2015-10-26 DIAGNOSIS — O9102 Infection of nipple associated with the puerperium: Principal | ICD-10-CM

## 2015-10-26 DIAGNOSIS — B3789 Other sites of candidiasis: Secondary | ICD-10-CM

## 2015-10-26 MED ORDER — FLUCONAZOLE 150 MG PO TABS: ORAL_TABLET | ORAL | 0 refills | Status: DC

## 2015-10-26 NOTE — Telephone Encounter (Signed)
Pt called stating that her baby has thrush and wanted to know if she could get Diflucan for herself.  RX sent to her pharmacy.

## 2015-11-17 ENCOUNTER — Ambulatory Visit: Payer: Medicaid Other | Admitting: Obstetrics & Gynecology

## 2015-12-01 ENCOUNTER — Encounter: Payer: Self-pay | Admitting: Obstetrics & Gynecology

## 2015-12-01 ENCOUNTER — Encounter: Payer: Self-pay | Admitting: *Deleted

## 2015-12-01 ENCOUNTER — Ambulatory Visit (INDEPENDENT_AMBULATORY_CARE_PROVIDER_SITE_OTHER): Payer: Medicaid Other | Admitting: Obstetrics & Gynecology

## 2015-12-01 VITALS — BP 135/90 | HR 81 | Ht 66.0 in | Wt 260.0 lb

## 2015-12-01 DIAGNOSIS — Z309 Encounter for contraceptive management, unspecified: Secondary | ICD-10-CM | POA: Diagnosis not present

## 2015-12-01 DIAGNOSIS — Z3202 Encounter for pregnancy test, result negative: Secondary | ICD-10-CM

## 2015-12-01 DIAGNOSIS — Z23 Encounter for immunization: Secondary | ICD-10-CM | POA: Diagnosis not present

## 2015-12-01 DIAGNOSIS — Z30430 Encounter for insertion of intrauterine contraceptive device: Secondary | ICD-10-CM

## 2015-12-01 LAB — POCT URINE PREGNANCY: Preg Test, Ur: NEGATIVE

## 2015-12-01 MED ORDER — LEVONORGESTREL 18.6 MCG/DAY IU IUD
INTRAUTERINE_SYSTEM | Freq: Once | INTRAUTERINE | Status: AC
  Administered 2015-12-01: 15:00:00 via INTRAUTERINE

## 2015-12-01 NOTE — Progress Notes (Signed)
Subjective:     Tracy Lynn is a S AA 37P1 (son)  30 y.o. female who presents for a postpartum visit. She is 11 weeks postpartum following a low cervical transverse Cesarean section. I have fully reviewed the prenatal and intrapartum course. The delivery was at 41 gestational weeks. Outcome: primary cesarean section, low transverse incision. Anesthesia: epidural. Postpartum course has been normal. Baby's course has been normal. Baby is feeding by breast. Bleeding no bleeding. Bowel function is normal. Bladder function is normal. Patient is not sexually active. Contraception method is abstinence. Postpartum depression screening: negative.  The following portions of the patient's history were reviewed and updated as appropriate: allergies, current medications, past family history, past medical history, past social history, past surgical history and problem list.  Review of Systems Pertinent items are noted in HPI.   Objective:    BP 135/90   Pulse 81   Ht 5\' 6"  (1.676 m)   Wt 260 lb (117.9 kg)   Breastfeeding? Yes   BMI 41.97 kg/m   General:  alert   Breasts:  inspection negative, no nipple discharge or bleeding, no masses or nodularity palpable  Lungs: clear to auscultation bilaterally  Heart:  regular rate and rhythm, S1, S2 normal, no murmur, click, rub or gallop  Abdomen: soft, non-tender; bowel sounds normal; no masses,  no organomegaly   Vulva:  normal  Vagina: normal vagina  Cervix:  anteverted  Corpus: normal  Adnexa:  normal adnexa  Rectal Exam: Not performed.       UPT negative, consent signed, Time out procedure done. Cervix prepped with betadine and grasped with a single tooth tenaculum. Liletta was easily placed and the strings were cut to 3-4 cm. Uterus sounded to 9 cm. She tolerated the procedure well.   Assessment:    normal postpartum exam. Pap smear not done at today's visit.   Plan:    1. Contraception: IUD 2. Rec back up for 2 weeks 3. Follow up in: 1  year or as needed.

## 2016-04-22 ENCOUNTER — Encounter (HOSPITAL_COMMUNITY): Payer: Self-pay | Admitting: *Deleted

## 2017-01-29 IMAGING — DX DG TOE 4TH 2+V*R*
3 series · 3 of 3 positions shown · non-contrast
Comparison: None.

CLINICAL DATA: Patient fell while chasing dog tonight. Pain and
laceration to the right fourth toe.

EXAM:
RIGHT FOURTH TOE

[toe ap]
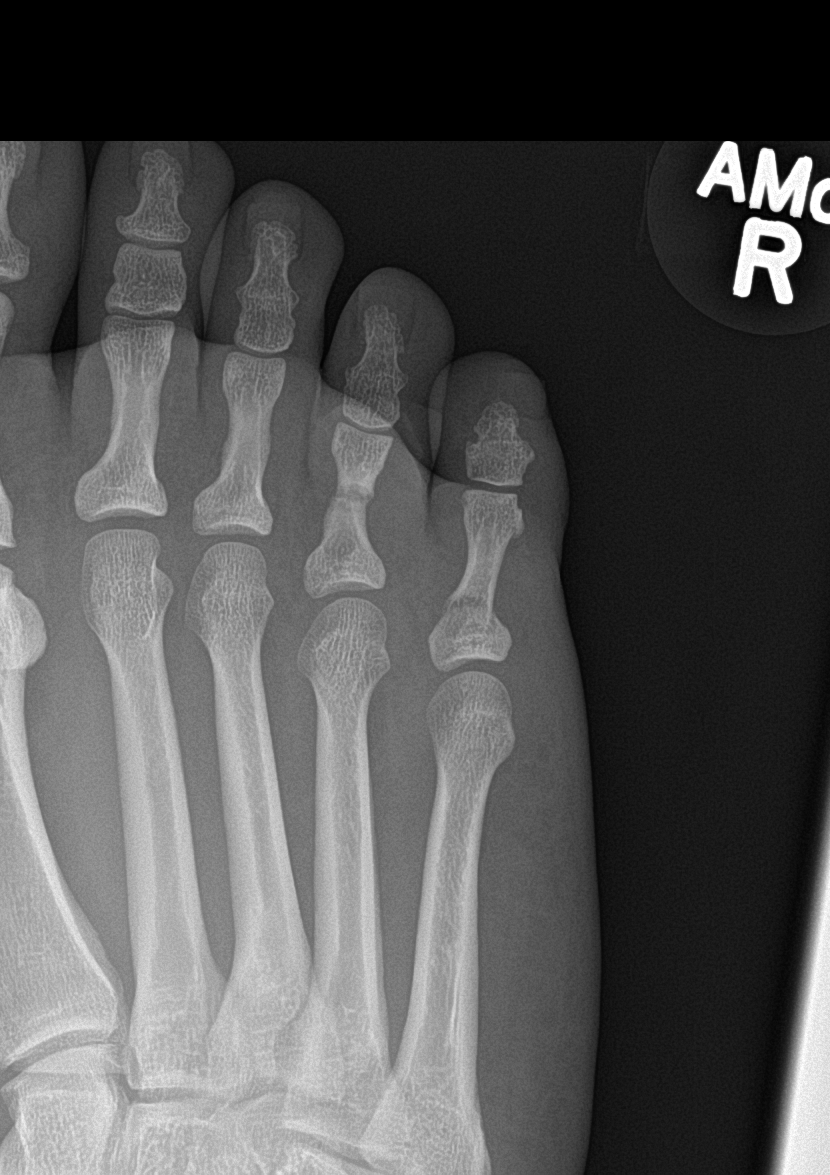

[toe obl]
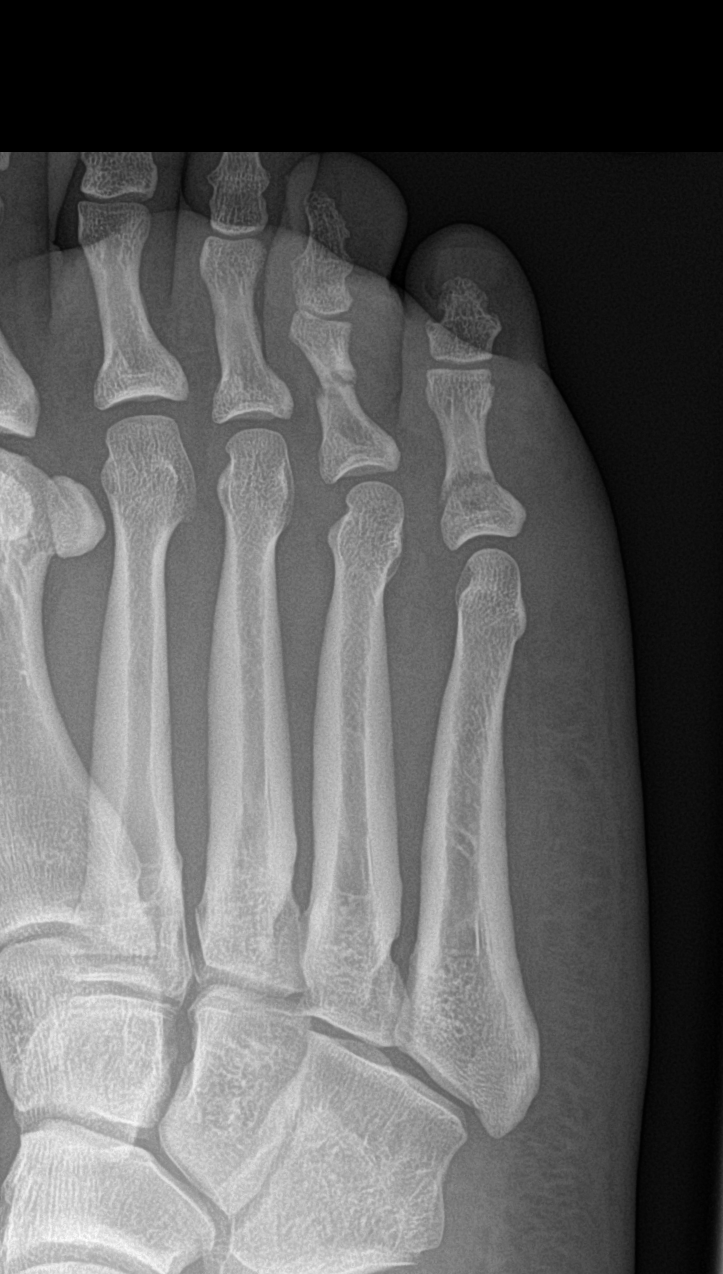

[toe lat]
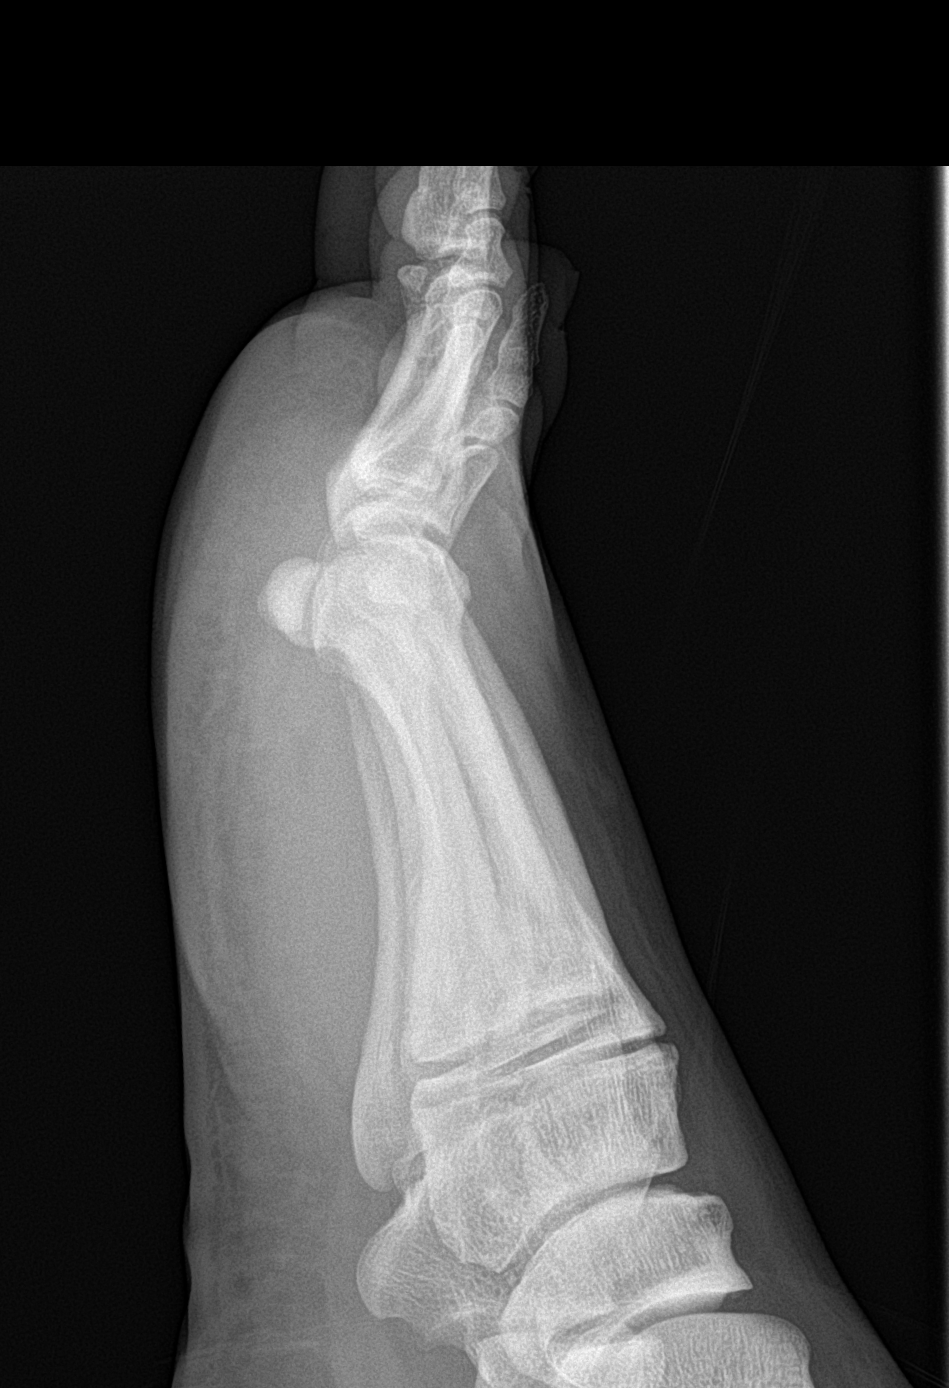

[3 of 3 positions shown; findings below may reference images not displayed]

FINDINGS: Transverse fractures are demonstrated in the midshaft of the
proximal phalanx right fourth toe and of the proximal shaft of the
proximal phalanx right fifth toe. No articular involvement is
identified. Soft tissue swelling is present. No radiopaque soft
tissue foreign bodies.
IMPRESSION: Transverse fractures of the proximal phalanges of the right fourth
and fifth toes.

## 2021-01-10 ENCOUNTER — Emergency Department (HOSPITAL_BASED_OUTPATIENT_CLINIC_OR_DEPARTMENT_OTHER): Payer: No Typology Code available for payment source

## 2021-01-10 ENCOUNTER — Observation Stay (HOSPITAL_BASED_OUTPATIENT_CLINIC_OR_DEPARTMENT_OTHER)
Admission: EM | Admit: 2021-01-10 | Discharge: 2021-01-11 | Disposition: A | Discharge status: Home / Self Care | Payer: No Typology Code available for payment source | Attending: Family Medicine | Admitting: Family Medicine

## 2021-01-10 ENCOUNTER — Other Ambulatory Visit: Payer: Self-pay

## 2021-01-10 ENCOUNTER — Encounter (HOSPITAL_BASED_OUTPATIENT_CLINIC_OR_DEPARTMENT_OTHER): Payer: Self-pay

## 2021-01-10 DIAGNOSIS — R0602 Shortness of breath: Secondary | ICD-10-CM | POA: Diagnosis present

## 2021-01-10 DIAGNOSIS — Z91040 Latex allergy status: Secondary | ICD-10-CM | POA: Diagnosis not present

## 2021-01-10 DIAGNOSIS — Z20822 Contact with and (suspected) exposure to covid-19: Secondary | ICD-10-CM | POA: Diagnosis not present

## 2021-01-10 DIAGNOSIS — J4521 Mild intermittent asthma with (acute) exacerbation: Secondary | ICD-10-CM | POA: Diagnosis not present

## 2021-01-10 DIAGNOSIS — Z87891 Personal history of nicotine dependence: Secondary | ICD-10-CM | POA: Insufficient documentation

## 2021-01-10 DIAGNOSIS — F17200 Nicotine dependence, unspecified, uncomplicated: Secondary | ICD-10-CM | POA: Diagnosis present

## 2021-01-10 DIAGNOSIS — J45901 Unspecified asthma with (acute) exacerbation: Secondary | ICD-10-CM | POA: Diagnosis present

## 2021-01-10 DIAGNOSIS — J4540 Moderate persistent asthma, uncomplicated: Secondary | ICD-10-CM

## 2021-01-10 LAB — LACTIC ACID, PLASMA
Lactic Acid, Venous: 0.9 mmol/L (ref 0.5–1.9)
Lactic Acid, Venous: 0.8 mmol/L (ref 0.5–1.9)

## 2021-01-10 LAB — I-STAT VENOUS BLOOD GAS, ED
Acid-base deficit: 1 mmol/L (ref 0.0–2.0)
Bicarbonate: 26.9 mmol/L (ref 20.0–28.0)
Calcium, Ion: 1.18 mmol/L (ref 1.15–1.40)
HCT: 46 % (ref 36.0–46.0)
Hemoglobin: 15.6 g/dL — ABNORMAL HIGH (ref 12.0–15.0)
O2 Saturation: 99 %
Patient temperature: 97.8
Potassium: 3.4 mmol/L — ABNORMAL LOW (ref 3.5–5.1)
Sodium: 141 mmol/L (ref 135–145)
TCO2: 29 mmol/L (ref 22–32)
pCO2, Ven: 53.8 mmHg (ref 44.0–60.0)
pH, Ven: 7.304 (ref 7.250–7.430)
pO2, Ven: 139 mmHg — ABNORMAL HIGH (ref 32.0–45.0)

## 2021-01-10 LAB — COMPREHENSIVE METABOLIC PANEL
ALT: 21 U/L (ref 0–44)
AST: 27 U/L (ref 15–41)
Albumin: 4.4 g/dL (ref 3.5–5.0)
Alkaline Phosphatase: 90 U/L (ref 38–126)
Anion gap: 10 (ref 5–15)
BUN: 9 mg/dL (ref 6–20)
CO2: 23 mmol/L (ref 22–32)
Calcium: 9 mg/dL (ref 8.9–10.3)
Chloride: 104 mmol/L (ref 98–111)
Creatinine, Ser: 0.84 mg/dL (ref 0.44–1.00)
GFR, Estimated: >60 mL/min (ref >60)
Glucose, Bld: 120 mg/dL — ABNORMAL HIGH (ref 70–99)
Potassium: 3.5 mmol/L (ref 3.5–5.1)
Sodium: 137 mmol/L (ref 135–145)
Total Bilirubin: 0.3 mg/dL (ref 0.3–1.2)
Total Protein: 8.3 g/dL — ABNORMAL HIGH (ref 6.5–8.1)

## 2021-01-10 LAB — RESP PANEL BY RT-PCR (FLU A&B, COVID) ARPGX2
Influenza A by PCR: NEGATIVE
Influenza B by PCR: NEGATIVE
SARS Coronavirus 2 by RT PCR: NEGATIVE

## 2021-01-10 LAB — CBC WITH DIFFERENTIAL/PLATELET
Abs Immature Granulocytes: 0.06 10*3/uL (ref 0.00–0.07)
Basophils Absolute: 0.1 10*3/uL (ref 0.0–0.1)
Basophils Relative: 1 %
Eosinophils Absolute: 2.5 10*3/uL — ABNORMAL HIGH (ref 0.0–0.5)
Eosinophils Relative: 17 %
HCT: 44 % (ref 36.0–46.0)
Hemoglobin: 14.8 g/dL (ref 12.0–15.0)
Immature Granulocytes: 0 %
Lymphocytes Relative: 32 %
Lymphs Abs: 4.7 10*3/uL — ABNORMAL HIGH (ref 0.7–4.0)
MCH: 30.2 pg (ref 26.0–34.0)
MCHC: 33.6 g/dL (ref 30.0–36.0)
MCV: 89.8 fL (ref 80.0–100.0)
Monocytes Absolute: 1.3 10*3/uL — ABNORMAL HIGH (ref 0.1–1.0)
Monocytes Relative: 9 %
Neutro Abs: 6.1 10*3/uL (ref 1.7–7.7)
Neutrophils Relative %: 41 %
Platelets: 337 10*3/uL (ref 150–400)
RBC: 4.9 MIL/uL (ref 3.87–5.11)
RDW: 12.8 % (ref 11.5–15.5)
Smear Review: NORMAL
WBC Morphology: >10
WBC: 14.8 10*3/uL — ABNORMAL HIGH (ref 4.0–10.5)
nRBC: 0 % (ref 0.0–0.2)

## 2021-01-10 LAB — TROPONIN I (HIGH SENSITIVITY)
Troponin I (High Sensitivity): 3 ng/L (ref <18)
Troponin I (High Sensitivity): 3 ng/L (ref <18)

## 2021-01-10 LAB — D-DIMER, QUANTITATIVE: D-Dimer, Quant: 0.43 ug/mL-FEU (ref 0.00–0.50)

## 2021-01-10 LAB — BRAIN NATRIURETIC PEPTIDE: B Natriuretic Peptide: 7.7 pg/mL (ref 0.0–100.0)

## 2021-01-10 MED ORDER — IPRATROPIUM-ALBUTEROL 0.5-2.5 (3) MG/3ML IN SOLN
3.0000 mL | Freq: Once | RESPIRATORY_TRACT | Status: AC
  Administered 2021-01-10: 23:00:00 3 mL via RESPIRATORY_TRACT
  Filled 2021-01-10: qty 3

## 2021-01-10 MED ORDER — IPRATROPIUM BROMIDE 0.02 % IN SOLN
0.5000 mg | RESPIRATORY_TRACT | Status: AC
  Administered 2021-01-10 (×3): 0.5 mg via RESPIRATORY_TRACT
  Filled 2021-01-10: qty 2.5

## 2021-01-10 MED ORDER — IPRATROPIUM-ALBUTEROL 0.5-2.5 (3) MG/3ML IN SOLN
3.0000 mL | RESPIRATORY_TRACT | Status: DC
  Administered 2021-01-11: 04:00:00 3 mL via RESPIRATORY_TRACT
  Filled 2021-01-10: qty 3

## 2021-01-10 MED ORDER — ALBUTEROL (5 MG/ML) CONTINUOUS INHALATION SOLN
10.0000 mg/h | INHALATION_SOLUTION | Freq: Once | RESPIRATORY_TRACT | Status: AC
  Administered 2021-01-10: 21:00:00 10 mg/h via RESPIRATORY_TRACT
  Filled 2021-01-10: qty 20

## 2021-01-10 MED ORDER — MAGNESIUM SULFATE 2 GM/50ML IV SOLN
2.0000 g | Freq: Once | INTRAVENOUS | Status: AC
  Administered 2021-01-10: 20:00:00 2 g via INTRAVENOUS
  Filled 2021-01-10: qty 50

## 2021-01-10 MED ORDER — METHYLPREDNISOLONE SODIUM SUCC 125 MG IJ SOLR
125.0000 mg | Freq: Once | INTRAMUSCULAR | Status: AC
  Administered 2021-01-10: 20:00:00 125 mg via INTRAVENOUS
  Filled 2021-01-10: qty 2

## 2021-01-10 MED ORDER — ALBUTEROL SULFATE (2.5 MG/3ML) 0.083% IN NEBU: 2.5000 mg | INHALATION_SOLUTION | RESPIRATORY_TRACT | Status: DC | PRN

## 2021-01-10 NOTE — Progress Notes (Signed)
Removed patient from non invasive ventilation.  Patient tolerating well on room air.  SPO2 94%, RR 12, HR 106.

## 2021-01-10 NOTE — ED Triage Notes (Addendum)
Pt present to ED with c/o SOB-labored breathing/speaking in short sentences-taken to tx room via w/c-initial O2 sat 70s-RT placed pt on O2 [place]

## 2021-01-10 NOTE — Plan of Care (Signed)
TRH transfer note:  35 yo F, h/o asthma and obesity.  Came in with resp distress and acute hypoxic resp failure.  Initially required BIPAP, but now weaned off.  CXR neg, COVID and flu neg.  Got cont neb, steroids, mag, etc.  Will send to progressive at [facility].  TRH will assume care on arrival to accepting facility. Until arrival, care as per EDP. However, TRH available 24/7 for questions and assistance.  Nursing staff, please page [facility] Admits and Consults [phone]) as soon as the patient arrives the hospital.

## 2021-01-10 NOTE — ED Notes (Signed)
Resp swab deferred to resp status and increased WOB. Pt currently on BiPap. When RT feels comfortable, mask will be removed for swab.

## 2021-01-10 NOTE — ED Provider Notes (Signed)
[facility]PART[facility] Provider Note   CSN: [id number] Arrival date & time: 01/10/21  1938     History Chief Complaint  Patient presents with   Shortness of Breath    Tracy Lynn is a 35 y.o. female.  HPI      35yo female with history of asthma (has diagnosis/one attach 15 years ago) presents with concern for shortness of breath and cough.  Reports symptoms for the past 3 days of cough and wheezing. Someone had given her an inhaler which she was using.  Symptoms progressed and worsened tonight when she was about to have dinner.  No severe shortness of breath. Arrives in respiratory distress.   Has not had fever, congestion, sore throat.  Has chest tightness, dyspnea, reports it feels like her asthma on initial history but is under distress and later reports she has only had one prior asthma attack 15 years ago and is not on other medications. Has not been hospitalized for asthma before.  Vapes but does not smoke cigarettes anymore.  No drug use. Had some champagne earlier today.  IUD in place, not on oral estrogen. No leg pain or swelling, no recent travel or immobilization, no fam hx of DVT/PE.   Past Medical History:  Diagnosis Date   Asthma     Patient Active Problem List   Diagnosis Date Noted   Vaping nicotine dependence, non-tobacco product 01/11/2021   Asthma exacerbation 01/10/2021    Past Surgical History:  Procedure Laterality Date   CESAREAN SECTION N/A 09/14/2015   Procedure: CESAREAN SECTION;  Surgeon: Mora Bellman, MD;  Location: [facility];  Service: Obstetrics;  Laterality: N/A;   NO PAST SURGERIES     WISDOM TOOTH EXTRACTION       OB History     Gravida  2   Para  1   Term  1   Preterm      AB  1   Living  0      SAB  1   IAB      Ectopic      Multiple  0   Live Births              Family History  Problem Relation Age of Onset   Cancer Maternal Grandmother        colon   Hypertension  Paternal Grandmother    Diabetes Paternal Grandmother     Social History   Tobacco Use   Smoking status: Former    Packs/day: 0.50    Types: Cigarettes    Quit date: 06/17/2013    Years since quitting: 7.5   Smokeless tobacco: Never  Vaping Use   Vaping Use: Every day  Substance Use Topics   Alcohol use: Yes    Alcohol/week: 8.0 standard drinks    Types: 8 Standard drinks or equivalent per week    Comment: weekly   Drug use: Yes    Types: Marijuana    Home Medications Prior to Admission medications   Medication Sig Start Date End Date Taking? Authorizing Provider  dextromethorphan-guaiFENesin (MUCINEX DM) 30-600 MG 12hr tablet Take 1 tablet by mouth 2 (two) times daily as needed for cough.   Yes [provider]  naproxen sodium (ALEVE) 220 MG tablet Take 440 mg by mouth daily as needed (pain).   Yes [provider]    Allergies    Diphenhydramine hcl and Latex  Review of Systems   Review of Systems  Constitutional:  Negative  for fever.  HENT:  Negative for sore throat.   Eyes:  Negative for visual disturbance.  Respiratory:  Positive for cough, shortness of breath and wheezing.   Cardiovascular:  Negative for chest pain and leg swelling.  Gastrointestinal:  Negative for abdominal pain, nausea and vomiting.  Genitourinary:  Negative for difficulty urinating.  Musculoskeletal:  Negative for back pain and neck pain.  Skin:  Negative for rash.  Neurological:  Negative for syncope and headaches.   Physical Exam Updated Vital Signs BP (!) 162/87 (BP Location: Left Arm)    Pulse 86    Temp 98.2 F (36.8 C) (Oral)    Resp 18    Ht 5\' 6"  (1.676 m)    Wt 125.4 kg    SpO2 95%    BMI 44.62 kg/m   Physical Exam Vitals and nursing note reviewed.  Constitutional:      General: She is not in acute distress.    Appearance: She is well-developed. She is ill-appearing and diaphoretic.  HENT:     Head: Normocephalic and atraumatic.  Eyes:      Conjunctiva/sclera: Conjunctivae normal.  Cardiovascular:     Rate and Rhythm: Normal rate and regular rhythm.     Heart sounds: Normal heart sounds. No murmur heard.   No friction rub. No gallop.  Pulmonary:     Effort: Tachypnea and respiratory distress present.     Breath sounds: Decreased breath sounds and wheezing present. No rales.  Abdominal:     General: There is no distension.     Palpations: Abdomen is soft.     Tenderness: There is no abdominal tenderness. There is no guarding.  Musculoskeletal:        General: No tenderness.     Cervical back: Normal range of motion.  Skin:    General: Skin is warm.     Findings: No erythema or rash.  Neurological:     Mental Status: She is alert and oriented to person, place, and time.    ED Results / Procedures / Treatments   Labs (all labs ordered are listed, but only abnormal results are displayed) Labs Reviewed  CBC WITH DIFFERENTIAL/PLATELET - Abnormal; Notable for the following components:      Result Value   WBC 14.8 (*)    Lymphs Abs 4.7 (*)    Monocytes Absolute 1.3 (*)    Eosinophils Absolute 2.5 (*)    All other components within normal limits  COMPREHENSIVE METABOLIC PANEL - Abnormal; Notable for the following components:   Glucose, Bld 120 (*)    Total Protein 8.3 (*)    All other components within normal limits  I-STAT VENOUS BLOOD GAS, ED - Abnormal; Notable for the following components:   pO2, Ven 139.0 (*)    Potassium 3.4 (*)    Hemoglobin 15.6 (*)    All other components within normal limits  RESP PANEL BY RT-PCR (FLU A&B, COVID) ARPGX2  CULTURE, BLOOD (ROUTINE X 2)  CULTURE, BLOOD (ROUTINE X 2)  BRAIN NATRIURETIC PEPTIDE  LACTIC ACID, PLASMA  LACTIC ACID, PLASMA  PREGNANCY, URINE  D-DIMER, QUANTITATIVE  PATHOLOGIST SMEAR REVIEW  HIV ANTIBODY (ROUTINE TESTING W REFLEX)  TROPONIN I (HIGH SENSITIVITY)  TROPONIN I (HIGH SENSITIVITY)    EKG EKG Interpretation  Date/Time:  Sunday January 10 2021  19:54:05 EST Ventricular Rate:  119 PR Interval:  149 QRS Duration: 82 QT Interval:  324 QTC Calculation: 456 R Axis:   79 Text Interpretation: Sinus tachycardia Biatrial enlargement Minimal  ST depression, inferior leads No previous ECGs available Confirmed by Alvira Monday ([id number]) on 01/10/2021 8:06:04 PM  Radiology DG Chest Portable 1 View  Result Date: 01/10/2021 CLINICAL DATA:  Shortness of breath.  Difficulty breathing. EXAM: PORTABLE CHEST 1 VIEW COMPARISON:  None. FINDINGS: Artifact overlies the chest. Heart and mediastinal shadows are normal. No pulmonary consolidation, collapse or effusion. IMPRESSION: Overlying artifact. Some under penetration. No active disease identified. Electronically Signed   By: Paulina Fusi M.D.   On: 01/10/2021 20:32    Procedures .Critical Care Performed by: Alvira Monday, MD Authorized by: Alvira Monday, MD   Critical care provider statement:    Critical care time (minutes):  30   Critical care was time spent personally by me on the following activities:  Development of treatment plan with patient or surrogate, evaluation of patient's response to treatment, examination of patient, ordering and review of laboratory studies, ordering and review of radiographic studies, ordering and performing treatments and interventions, pulse oximetry, re-evaluation of patient's condition and review of old charts   Medications Ordered in ED Medications  ipratropium-albuterol (DUONEB) 0.5-2.5 (3) MG/3ML nebulizer solution 3 mL (3 mLs Nebulization Given 01/11/21 1003)  predniSONE (DELTASONE) tablet 40 mg (40 mg Oral Given 01/11/21 0933)  acetaminophen (TYLENOL) tablet 650 mg (has no administration in time range)    Or  acetaminophen (TYLENOL) suppository 650 mg (has no administration in time range)  ondansetron (ZOFRAN) tablet 4 mg (has no administration in time range)    Or  ondansetron (ZOFRAN) injection 4 mg (has no administration in time range)   pneumococcal 23 valent vaccine (PNEUMOVAX-23) injection 0.5 mL (has no administration in time range)  methylPREDNISolone sodium succinate (SOLU-MEDROL) 125 mg/2 mL injection 125 mg (125 mg Intravenous Given 01/10/21 2010)  magnesium sulfate IVPB 2 g 50 mL (0 g Intravenous Stopped 01/10/21 2125)  albuterol (PROVENTIL,VENTOLIN) solution continuous neb (10 mg/hr Nebulization Given 01/10/21 2039)  ipratropium (ATROVENT) nebulizer solution 0.5 mg (0.5 mg Nebulization Given 01/10/21 2103)  ipratropium-albuterol (DUONEB) 0.5-2.5 (3) MG/3ML nebulizer solution 3 mL (3 mLs Nebulization Given 01/10/21 2327)    ED Course  I have reviewed the triage vital signs and the nursing notes.  Pertinent labs & imaging results that were available during my care of the patient were reviewed by me and considered in my medical decision making (see chart for details).    MDM Rules/Calculators/A&P                          35yo female with history of asthma (has diagnosis/one exacerbation 15 years ago but not on regular medication) presents with concern for shortness of breath and cough.  Arrives tachypneic in respiratory distress, saturations in 70s, diaphoretic, wheezing and decreased breath sounds.  Placed on BiPAP, continous albuterol, atrovent, given magnesium/solumedrol.    XR without pneumothorax or signs of pneumonia or pulmonary edema. DDimer negative, low suspicion for PE. Troponin negative, no signs of ACS. BNP WNL doubt CHF. No anemia or significant electrolyte abnormalities. Lactate WNL.  History and exam most consistent with asthma exacerbation. Significant improvement with therapies above> Transitioned off of bipap, and to room air with normal saturation> Does have continued wheezing.  Will admit for continued care.        Final Clinical Impression(s) / ED Diagnoses Final diagnoses:  Mild intermittent asthma with acute exacerbation    Rx / DC Orders ED Discharge Orders     None  Gareth Morgan, MD 01/11/21 [phone]

## 2021-01-11 ENCOUNTER — Encounter (HOSPITAL_COMMUNITY): Payer: Self-pay | Admitting: Internal Medicine

## 2021-01-11 DIAGNOSIS — J4531 Mild persistent asthma with (acute) exacerbation: Secondary | ICD-10-CM

## 2021-01-11 DIAGNOSIS — F17200 Nicotine dependence, unspecified, uncomplicated: Secondary | ICD-10-CM

## 2021-01-11 DIAGNOSIS — J4521 Mild intermittent asthma with (acute) exacerbation: Secondary | ICD-10-CM | POA: Diagnosis not present

## 2021-01-11 DIAGNOSIS — J4540 Moderate persistent asthma, uncomplicated: Secondary | ICD-10-CM

## 2021-01-11 DIAGNOSIS — Z87891 Personal history of nicotine dependence: Secondary | ICD-10-CM | POA: Diagnosis not present

## 2021-01-11 DIAGNOSIS — Z91040 Latex allergy status: Secondary | ICD-10-CM | POA: Diagnosis not present

## 2021-01-11 DIAGNOSIS — R0602 Shortness of breath: Secondary | ICD-10-CM | POA: Diagnosis present

## 2021-01-11 DIAGNOSIS — Z20822 Contact with and (suspected) exposure to covid-19: Secondary | ICD-10-CM | POA: Diagnosis not present

## 2021-01-11 DIAGNOSIS — J4541 Moderate persistent asthma with (acute) exacerbation: Secondary | ICD-10-CM

## 2021-01-11 LAB — HIV ANTIBODY (ROUTINE TESTING W REFLEX): HIV Screen 4th Generation wRfx: NONREACTIVE

## 2021-01-11 LAB — PREGNANCY, URINE: Preg Test, Ur: NEGATIVE

## 2021-01-11 MED ORDER — ALBUTEROL SULFATE HFA 108 (90 BASE) MCG/ACT IN AERS: 2.0000 | INHALATION_SPRAY | Freq: Four times a day (QID) | RESPIRATORY_TRACT | 11 refills | Status: DC | PRN

## 2021-01-11 MED ORDER — ONDANSETRON HCL 4 MG/2ML IJ SOLN: 4.0000 mg | Freq: Four times a day (QID) | INTRAMUSCULAR | Status: DC | PRN

## 2021-01-11 MED ORDER — ONDANSETRON HCL 4 MG PO TABS: 4.0000 mg | ORAL_TABLET | Freq: Four times a day (QID) | ORAL | Status: DC | PRN

## 2021-01-11 MED ORDER — IPRATROPIUM-ALBUTEROL 0.5-2.5 (3) MG/3ML IN SOLN
3.0000 mL | RESPIRATORY_TRACT | Status: DC | PRN
  Administered 2021-01-11: 10:00:00 3 mL via RESPIRATORY_TRACT
  Filled 2021-01-11: qty 3

## 2021-01-11 MED ORDER — PREDNISONE 20 MG PO TABS
40.0000 mg | ORAL_TABLET | Freq: Every day | ORAL | Status: DC
Start: 2021-01-11 — End: 2021-01-11
  Administered 2021-01-11: 10:00:00 40 mg via ORAL
  Filled 2021-01-11: qty 2

## 2021-01-11 MED ORDER — ALBUTEROL SULFATE (2.5 MG/3ML) 0.083% IN NEBU: 2.5000 mg | INHALATION_SOLUTION | RESPIRATORY_TRACT | 11 refills | Status: DC | PRN

## 2021-01-11 MED ORDER — PNEUMOCOCCAL VAC POLYVALENT 25 MCG/0.5ML IJ INJ
0.5000 mL | INJECTION | INTRAMUSCULAR | Status: DC | PRN
  Filled 2021-01-11: qty 0.5

## 2021-01-11 MED ORDER — ACETAMINOPHEN 325 MG PO TABS: 650.0000 mg | ORAL_TABLET | Freq: Four times a day (QID) | ORAL | Status: DC | PRN

## 2021-01-11 MED ORDER — ACETAMINOPHEN 650 MG RE SUPP: 650.0000 mg | Freq: Four times a day (QID) | RECTAL | Status: DC | PRN

## 2021-01-11 MED ORDER — PREDNISONE 20 MG PO TABS: 40.0000 mg | ORAL_TABLET | Freq: Every day | ORAL | 0 refills | Status: DC

## 2021-01-11 NOTE — TOC Initial Note (Signed)
Transition of Care [facility]) - Initial/Assessment Note    Patient Details  Name: Promiss Labarbera MRN: [id number] Date of Birth: 1985/07/28  Transition of Care [facility]) CM/SW Contact:    Golda Acre, RN Phone Number: 01/11/2021, 9:59 AM  Clinical Narrative:                  Transition of Care [facility]) Screening Note   Patient Details  Name: Mansi Tokar Date of Birth: 05-Dec-1985   Transition of Care [facility]) CM/SW Contact:    Golda Acre, RN Phone Number: 01/11/2021, 9:59 AM    Transition of Care Department [facility]) has reviewed patient and no TOC needs have been identified at this time. We will continue to monitor patient advancement through interdisciplinary progression rounds. If new patient transition needs arise, please place a TOC consult.    Expected Discharge Plan: Home/Self Care Barriers to Discharge: No Barriers Identified   Patient Goals and CMS Choice Patient states their goals for this hospitalization and ongoing recovery are:: to go back home CMS Medicare.gov Compare Post Acute Care list provided to:: Patient    Expected Discharge Plan and Services Expected Discharge Plan: Home/Self Care   Discharge Planning Services: CM Consult   Living arrangements for the past 2 months: Apartment                                      Prior Living Arrangements/Services Living arrangements for the past 2 months: Apartment Lives with:: Self Patient language and need for interpreter reviewed:: Yes Do you feel safe going back to the place where you live?: Yes            Criminal Activity/Legal Involvement Pertinent to Current Situation/Hospitalization: No - Comment as needed  Activities of Daily Living Home Assistive Devices/Equipment: None ADL Screening (condition at time of admission) Patient's cognitive ability adequate to safely complete daily activities?: Yes Is the patient deaf or have difficulty hearing?: No Does the patient have difficulty  seeing, even when wearing glasses/contacts?: No Does the patient have difficulty concentrating, remembering, or making decisions?: No Patient able to express need for assistance with ADLs?: No Does the patient have difficulty dressing or bathing?: No Independently performs ADLs?: Yes (appropriate for developmental age) Does the patient have difficulty walking or climbing stairs?: No Weakness of Legs: None Weakness of Arms/Hands: None  Permission Sought/Granted                  Emotional Assessment Appearance:: Appears stated age Attitude/Demeanor/Rapport: Engaged Affect (typically observed): Calm Orientation: : Oriented to Situation, Oriented to  Time, Oriented to Place, Oriented to Self Alcohol / Substance Use: Not Applicable Psych Involvement: No (comment)  Admission diagnosis:  Asthma exacerbation [J45.901] Patient Active Problem List   Diagnosis Date Noted   Vaping nicotine dependence, non-tobacco product 01/11/2021   Asthma exacerbation 01/10/2021   PCP:  Pcp, No Pharmacy:   [id number] [place], [place] - [address] AT [facility] [address] [place] [address] Phone: [phone] Fax: [phone]     Social Determinants of Health (SDOH) Interventions    Readmission Risk Interventions No flowsheet data found.

## 2021-01-11 NOTE — TOC Transition Note (Signed)
Transition of Care [facility]) - CM/SW Discharge Note   Patient Details  Name: Tracy Lynn MRN: [id number] Date of Birth: 1985/03/10  Transition of Care [facility]) CM/SW Contact:  Golda Acre, RN Phone Number: 01/11/2021, 1:43 PM   Clinical Narrative:    Nebulizer machine ordered through [facility]   Final next level of care: Home/Self Care Barriers to Discharge: No Barriers Identified   Patient Goals and CMS Choice Patient states their goals for this hospitalization and ongoing recovery are:: to go back home CMS Medicare.gov Compare Post Acute Care list provided to:: Patient    Discharge Placement                       Discharge Plan and Services   Discharge Planning Services: CM Consult Post Acute Care Choice: Durable Medical Equipment          DME Arranged: Nebulizer machine DME Agency: [employer] Date DME Agency Contacted: 01/11/21 Time DME Agency Contacted: 1343 Representative spoke with at DME Agency: jermaine            Social Determinants of Health (SDOH) Interventions     Readmission Risk Interventions No flowsheet data found.

## 2021-01-11 NOTE — Subjective & Objective (Signed)
CC: SOB, wheezing HPI: 35 year old African-American female with a remote history of asthma more than 10 years ago presents to the ER today with respiratory extremis.  Patient states that she has been feeling under the weather over the last several days.  She has been using her old albuterol inhaler that she got several years ago frequently.  She states that she was cooking Christmas dinner.  She had the range hood on.  She states that after she got cooking dinner, she started feeling extremely short of breath.  She took several puffs of her inhaler which did not help her breathing.  Patient came to the ER.  She was noted to be in respiratory extremis with O2 sats of 70% on room air.  Patient placed on BiPAP and started on albuterol nebs.  Patient given IV magnesium, IV Solu-Medrol and continuous nebulizer treatment.  Her respiratory status improved and the patient was taken off BiPAP.  Due to her asthma exacerbation, triad hospitalist contacted for admission.  Patient denies any recent fever or chills.  She has had some upper respiratory congestion.  Patient tested negative for COVID and influenza.

## 2021-01-11 NOTE — Hospital Course (Signed)
Tracy Lynn is a 35 y.o. F with remote history of asthma, presented with respiratory extremis.  Was cooking Christmas dinner with the range hood on.  After a time, vapors/smoke from the food made her feel extremely short of breath, her inhaler didn't help, so she came urgently to the ER where her O2 sats were 70% and hse was in distress.  Started on BiPAP.  Given albtuerol, steroids, and magnesium.

## 2021-01-11 NOTE — Discharge Summary (Addendum)
Physician Discharge Summary   Patient: Tracy Lynn MRN: [id number] DOB: @DOB   Admit date:     01/10/2021  Discharge date: 01/11/21  Discharge Physician: 01/13/21   PCP: Pcp, No   Recommendations at discharge: 1. Follow up with PCP as soon as able         Discharge Diagnoses Principal Problem:   Asthma exacerbation Active Problems:   Vaping nicotine dependence, non-tobacco product     Hospital Course   Ms Register is a 35 y.o. F with remote history of asthma, presented with respiratory extremis.  Was cooking Christmas dinner with the range hood on.  After a time, vapors/smoke from the food made her feel extremely short of breath, her inhaler didn't help, so she came urgently to the ER where her O2 sats were 70% and hse was in distress.  Started on BiPAP.  Given albtuerol, steroids, and magnesium.       * Asthma exacerbation- (present on admission)  Vaping nicotine dependence, non-tobacco product- (present on admission)   Patient was admitted and started on bronchodilators and steroids and magnesium.  CXR without infiltrates.  Able to quickly wean off BiPAP and off O2.  Symptoms improved rapidly, and was able to walk around >380ft without hypoxia and only mild symptoms.  Discharged with 5 days prednisone, new Rx for albuterol.  Recommend to find new PCP and obtain spirometry               Disposition: Home Diet recommendation: Regular diet  DISCHARGE MEDICATION: Allergies as of 01/11/2021       Reactions   Diphenhydramine Hcl Nausea And Vomiting   Latex Itching        Medication List     TAKE these medications    albuterol (2.5 MG/3ML) 0.083% nebulizer solution Commonly known as: PROVENTIL Take 3 mLs (2.5 mg total) by nebulization every 4 (four) hours as needed for wheezing or shortness of breath.   albuterol 108 (90 Base) MCG/ACT inhaler Commonly known as: VENTOLIN HFA Inhale 2 puffs into the lungs every 6 (six) hours as  needed for wheezing or shortness of breath.   dextromethorphan-guaiFENesin 30-600 MG 12hr tablet Commonly known as: MUCINEX DM Take 1 tablet by mouth 2 (two) times daily as needed for cough.   naproxen sodium 220 MG tablet Commonly known as: ALEVE Take 440 mg by mouth daily as needed (pain).   predniSONE 20 MG tablet Commonly known as: DELTASONE Take 2 tablets (40 mg total) by mouth daily with breakfast. Start taking on: January 12, 2021               Durable Medical Equipment  (From admission, onward)           Start     Ordered   01/11/21 0000  For home use only DME Nebulizer machine       Question Answer Comment  Patient needs a nebulizer to treat with the following condition Asthma, chronic, unspecified asthma severity, with acute exacerbation   Length of Need Lifetime      01/11/21 1332            Discharge Instructions     Discharge instructions   Complete by: As directed    From Dr. 01/13/21: You were admitted for what appears to be an asthma flare.  In this case, it sounds as if that recent cold triggered an asthma flare, and then cooking made it suddenly worse.  As we talked about asthma is caused by  inflammation that leads to narrowing of the small airways in the lungs. To treat it, you have to reduce the inflammation. We do that with steroids, either pills by mouth (like prednisone) or inhaler steroids. In cases like yours, pill steroids are stronger.  Take the steroid prednisone for the next 5 days, starting tomorrow morning. Take prednisone 40 mg (two tabs) once daily in the morning with breakfast   Also, use medicines like albuterol (breathing treatments) which open the airways temporarily For the next week, take albuterol three times a day If you need extra treatments, you can take 1-2 extra treatments If you are using albuterol more than once an hour, come back to the hospital  (Note: I wrote a prescription for albuterol in a  nebulizer and also in a handheld pump.  Just get one or the other, depending on which is covered by your insurance and tell the [facility] to delete the other) (If you get the handheld pump, make sure you watch some YouTube videos about inhaler technique, to make sure you're doing it the right way)    Go see a primary care doctor as soon as you can Ask them about spirometry  Stop vaping, and do not smoke cigarettes or cigars.   For home use only DME Nebulizer machine   Complete by: As directed    Patient needs a nebulizer to treat with the following condition: Asthma, chronic, unspecified asthma severity, with acute exacerbation   Length of Need: Lifetime   Increase activity slowly   Complete by: As directed         Discharge Exam: Filed Weights   01/11/21 0526  Weight: 125.4 kg   General: Pt is alert, awake, not in acute distress, sitting up in bed Cardiovascular: RRR, nl S1-S2, no murmurs appreciated.   No LE edema.   Respiratory: Normal respiratory rate and rhythm.  Has some prolonged expiratory phase and brief, discontinuous wheezes. No rales. Abdominal: Abdomen soft and non-tender.  No distension or HSM.   Neuro/Psych: Strength symmetric in upper and lower extremities.  Judgment and insight appear normal.   Condition at discharge: good  The results of significant diagnostics from this hospitalization (including imaging, microbiology, ancillary and laboratory) are listed below for reference.   Imaging Studies: DG Chest Portable 1 View  Result Date: 01/10/2021 CLINICAL DATA:  Shortness of breath.  Difficulty breathing. EXAM: PORTABLE CHEST 1 VIEW COMPARISON:  None. FINDINGS: Artifact overlies the chest. Heart and mediastinal shadows are normal. No pulmonary consolidation, collapse or effusion. IMPRESSION: Overlying artifact. Some under penetration. No active disease identified. Electronically Signed   By: Paulina Fusi M.D.   On: 01/10/2021 20:32    Microbiology: Results  for orders placed or performed during the hospital encounter of 01/10/21  Blood culture (routine x 2)     Status: None (Preliminary result)   Collection Time: 01/10/21  7:55 PM   Specimen: Right Antecubital; Blood  Result Value Ref Range Status   Specimen Description   Final    RIGHT ANTECUBITAL BLOOD Performed at [facility], [address] [place], [place] [address]    Special Requests   Final    Blood Culture adequate volume BOTTLES DRAWN AEROBIC AND ANAEROBIC Performed at [facility], [address] [place], [place] [address]    Culture   Final    NO GROWTH < 12 HOURS Performed at [facility], [address] [place], [place] [address]    Report  Status PENDING  Incomplete  Resp Panel by RT-PCR (Flu A&B, Covid) Nasopharyngeal Swab     Status: None   Collection Time: 01/10/21  8:41 PM   Specimen: Nasopharyngeal Swab; Nasopharyngeal(NP) swabs in vial transport medium  Result Value Ref Range Status   SARS Coronavirus 2 by RT PCR NEGATIVE NEGATIVE Final    Comment: (NOTE) SARS-CoV-2 target nucleic acids are NOT DETECTED.  The SARS-CoV-2 RNA is generally detectable in upper respiratory specimens during the acute phase of infection. The lowest concentration of SARS-CoV-2 viral copies this assay can detect is 138 copies/mL. A negative result does not preclude SARS-Cov-2 infection and should not be used as the sole basis for treatment or other patient management decisions. A negative result may occur with  improper specimen collection/handling, submission of specimen other than nasopharyngeal swab, presence of viral mutation(s) within the areas targeted by this assay, and inadequate number of viral copies(<138 copies/mL). A negative result must be combined with clinical observations, patient history, and epidemiological information. The expected result is Negative.  Fact Sheet for Patients:  BloggerCourse.com  Fact Sheet  for Healthcare Providers:  SeriousBroker.it  This test is no t yet approved or cleared by the [place] FDA and  has been authorized for detection and/or diagnosis of SARS-CoV-2 by FDA under an Emergency Use Authorization (EUA). This EUA will remain  in effect (meaning this test can be used) for the duration of the COVID-19 declaration under Section 564(b)(1) of the Act, 21 U.S.C.section 360bbb-3(b)(1), unless the authorization is terminated  or revoked sooner.       Influenza A by PCR NEGATIVE NEGATIVE Final   Influenza B by PCR NEGATIVE NEGATIVE Final    Comment: (NOTE) The Xpert Xpress SARS-CoV-2/FLU/RSV plus assay is intended as an aid in the diagnosis of influenza from Nasopharyngeal swab specimens and should not be used as a sole basis for treatment. Nasal washings and aspirates are unacceptable for Xpert Xpress SARS-CoV-2/FLU/RSV testing.  Fact Sheet for Patients: BloggerCourse.com  Fact Sheet for Healthcare Providers: SeriousBroker.it  This test is not yet approved or cleared by the [place] FDA and has been authorized for detection and/or diagnosis of SARS-CoV-2 by FDA under an Emergency Use Authorization (EUA). This EUA will remain in effect (meaning this test can be used) for the duration of the COVID-19 declaration under Section 564(b)(1) of the Act, 21 U.S.C. section 360bbb-3(b)(1), unless the authorization is terminated or revoked.  Performed at [facility], [address] [place], [place] [address]   Blood culture (routine x 2)     Status: None (Preliminary result)   Collection Time: 01/10/21  8:51 PM   Specimen: BLOOD LEFT HAND  Result Value Ref Range Status   Specimen Description   Final    BLOOD LEFT HAND BLOOD Performed at [facility], [address] [place], [place] [address]    Special Requests   Final    Blood Culture results may not be  optimal due to an inadequate volume of blood received in culture bottles BOTTLES DRAWN AEROBIC AND ANAEROBIC Performed at [facility], [address] [place], [place] [address]    Culture   Final    NO GROWTH < 12 HOURS Performed at [facility], [address] [place], [place] [address]    Report Status PENDING  Incomplete    Labs: CBC: Recent Labs  Lab 01/10/21 1953 01/10/21 2018  WBC 14.8*  --   NEUTROABS 6.1  --  HGB 14.8 15.6*  HCT 44.0 46.0  MCV 89.8  --   PLT 337  --    Basic Metabolic Panel: Recent Labs  Lab 01/10/21 1953 01/10/21 2018  NA 137 141  K 3.5 3.4*  CL 104  --   CO2 23  --   GLUCOSE 120*  --   BUN 9  --   CREATININE 0.84  --   CALCIUM 9.0  --    Liver Function Tests: Recent Labs  Lab 01/10/21 1953  AST 27  ALT 21  ALKPHOS 90  BILITOT 0.3  PROT 8.3*  ALBUMIN 4.4   CBG: No results for input(s): GLUCAP in the last 168 hours.  Discharge time spent: less than 30 minutes.  Signed:  Alberteen Sam MD.  [facility] 01/11/2021

## 2021-01-11 NOTE — ED Notes (Signed)
[facility] present for pt transport to [facility] °

## 2021-01-11 NOTE — ED Notes (Signed)
Report called to [place], Charity fundraiser at [facility] 4th floor

## 2021-01-11 NOTE — ED Notes (Signed)
Pt ambulated to restroom with stand by assist.  

## 2021-01-11 NOTE — H&P (Signed)
History and Physical    Tracy Lynn ZOX:[id number] DOB: Sep 06, 1985 DOA: 01/10/2021  PCP: Pcp, No   Patient coming from: Home  I have personally briefly reviewed patient's old medical records in [place] [facility]  CC: SOB, wheezing HPI: 35 year old African-American female with a remote history of asthma more than 10 years ago presents to the ER today with respiratory extremis.  Patient states that she has been feeling under the weather over the last several days.  She has been using her old albuterol inhaler that she got several years ago frequently.  She states that she was cooking Christmas dinner.  She had the range hood on.  She states that after she got cooking dinner, she started feeling extremely short of breath.  She took several puffs of her inhaler which did not help her breathing.  Patient came to the ER.  She was noted to be in respiratory extremis with O2 sats of 70% on room air.  Patient placed on BiPAP and started on albuterol nebs.  Patient given IV magnesium, IV Solu-Medrol and continuous nebulizer treatment.  Her respiratory status improved and the patient was taken off BiPAP.  Due to her asthma exacerbation, [facility] hospital[facility] contacted for admission.  Patient denies any recent fever or chills.  She has had some upper respiratory congestion.  Patient tested negative for COVID and influenza.   ED Course: came to ER in respiratory extremis. RA sats 70s with increased WOB. Started on bipap. Given continuous albuterol neb. Given IV magnesium.  Review of Systems:  Review of Systems  Constitutional:  Positive for malaise/fatigue.  HENT:  Positive for congestion.   Eyes: Negative.   Respiratory:  Positive for cough, shortness of breath and wheezing.   Cardiovascular: Negative.   Gastrointestinal: Negative.   Genitourinary: Negative.   Musculoskeletal: Negative.   Skin: Negative.   Neurological: Negative.   Endo/Heme/Allergies: Negative.   Psychiatric/Behavioral:  Negative.    All other systems reviewed and are negative.  Past Medical History:  Diagnosis Date   Asthma     Past Surgical History:  Procedure Laterality Date   CESAREAN SECTION N/A 09/14/2015   Procedure: CESAREAN SECTION;  Surgeon: Catalina Antigua, MD;  Location: [facility];  Service: Obstetrics;  Laterality: N/A;   NO PAST SURGERIES     WISDOM TOOTH EXTRACTION       reports that she quit smoking about 7 years ago. Her smoking use included cigarettes. She smoked an average of .5 packs per day. She has never used smokeless tobacco. She reports current alcohol use of about 8.0 standard drinks per week. She reports current drug use. Drug: Marijuana. Pt vapes e-cigarettes every day.  Allergies  Allergen Reactions   Diphenhydramine Hcl Nausea And Vomiting   Latex Itching    Family History  Problem Relation Age of Onset   Cancer Maternal Grandmother        colon   Hypertension Paternal Grandmother    Diabetes Paternal Grandmother     Prior to Admission medications   Not on File  Takes no regular medications.  Physical Exam: Vitals:   01/11/21 0300 01/11/21 0331 01/11/21 0346 01/11/21 0526  BP: (!) 136/91 (!) 154/79  (!) 148/84  Pulse: (!) 105 93 94 86  Resp: 19 20 16 18   Temp:    98.6 F (37 C)  TempSrc:    Oral  SpO2: 98% 96% 96% 97%  Weight:    125.4 kg  Height:    5\' 6"  (1.676 m)  Physical Exam Vitals and nursing note reviewed.  Constitutional:      General: She is not in acute distress.    Appearance: Normal appearance. She is obese. She is not ill-appearing, toxic-appearing or diaphoretic.  HENT:     Head: Normocephalic and atraumatic.     Nose: Nose normal. No rhinorrhea.  Eyes:     General:        Right eye: No discharge.        Left eye: No discharge.  Cardiovascular:     Rate and Rhythm: Normal rate and regular rhythm.     Pulses: Normal pulses.  Pulmonary:     Effort: Pulmonary effort is normal. No respiratory distress.     Breath  sounds: Wheezing present. No rhonchi or rales.     Comments: Faint end expiratory wheeze bilateral lower lobes.  No respiratory distress.  No increased work of breathing.  No rhonchi. Abdominal:     General: Bowel sounds are normal. There is no distension.     Palpations: Abdomen is soft.     Tenderness: There is no abdominal tenderness. There is no guarding or rebound.  Musculoskeletal:     Right lower leg: No edema.     Left lower leg: No edema.  Skin:    General: Skin is warm and dry.     Capillary Refill: Capillary refill takes less than 2 seconds.  Neurological:     General: No focal deficit present.     Mental Status: She is alert and oriented to person, place, and time.     Labs on Admission: I have personally reviewed following labs and imaging studies  CBC: Recent Labs  Lab 01/10/21 1953 01/10/21 2018  WBC 14.8*  --   NEUTROABS 6.1  --   HGB 14.8 15.6*  HCT 44.0 46.0  MCV 89.8  --   PLT 337  --    Basic Metabolic Panel: Recent Labs  Lab 01/10/21 1953 01/10/21 2018  NA 137 141  K 3.5 3.4*  CL 104  --   CO2 23  --   GLUCOSE 120*  --   BUN 9  --   CREATININE 0.84  --   CALCIUM 9.0  --    GFR: Estimated Creatinine Clearance: 128.9 mL/min (by C-G formula based on SCr of 0.84 mg/dL). Liver Function Tests: Recent Labs  Lab 01/10/21 1953  AST 27  ALT 21  ALKPHOS 90  BILITOT 0.3  PROT 8.3*  ALBUMIN 4.4   No results for input(s): LIPASE, AMYLASE in the last 168 hours. No results for input(s): AMMONIA in the last 168 hours. Coagulation Profile: No results for input(s): INR, PROTIME in the last 168 hours. Cardiac Enzymes: No results for input(s): CKTOTAL, CKMB, CKMBINDEX, TROPONINI in the last 168 hours. BNP (last 3 results) No results for input(s): PROBNP in the last 8760 hours. HbA1C: No results for input(s): HGBA1C in the last 72 hours. CBG: No results for input(s): GLUCAP in the last 168 hours. Lipid Profile: No results for input(s): CHOL, HDL,  LDLCALC, TRIG, CHOLHDL, LDLDIRECT in the last 72 hours. Thyroid Function Tests: No results for input(s): TSH, T4TOTAL, FREET4, T3FREE, THYROIDAB in the last 72 hours. Anemia Panel: No results for input(s): VITAMINB12, FOLATE, FERRITIN, TIBC, IRON, RETICCTPCT in the last 72 hours. Urine analysis:    Component Value Date/Time   COLORURINE YELLOW 12/22/2010 2209   APPEARANCEUR CLOUDY (A) 12/22/2010 2209   LABSPEC 1.031 (H) 12/22/2010 2209   PHURINE 6.5 12/22/2010 2209  GLUCOSEU NEGATIVE 12/22/2010 2209   HGBUR NEGATIVE 12/22/2010 2209   BILIRUBINUR NEGATIVE 12/22/2010 2209   KETONESUR TRACE (A) 12/22/2010 2209   PROTEINUR NEGATIVE 12/22/2010 2209   UROBILINOGEN 1.0 12/22/2010 2209   NITRITE NEGATIVE 12/22/2010 2209   LEUKOCYTESUR SMALL (A) 12/22/2010 2209    Radiological Exams on Admission: I have personally reviewed images DG Chest Portable 1 View  Result Date: 01/10/2021 CLINICAL DATA:  Shortness of breath.  Difficulty breathing. EXAM: PORTABLE CHEST 1 VIEW COMPARISON:  None. FINDINGS: Artifact overlies the chest. Heart and mediastinal shadows are normal. No pulmonary consolidation, collapse or effusion. IMPRESSION: Overlying artifact. Some under penetration. No active disease identified. Electronically Signed   By: Paulina Fusi M.D.   On: 01/10/2021 20:32    EKG: I have personally reviewed EKG: sinus tachycardia    Assessment/Plan Principal Problem:   Asthma exacerbation Active Problems:   Vaping nicotine dependence, non-tobacco product   Asthma exacerbation Assigned to observation status.  On arrival to the hospital, patient was already on room air.  She was moving a fair amount of air.  She only had a faint end expiratory wheeze.  She was eating food from [place] and not in any respiratory distress.  Start p.o. prednisone.  Change duo nebs to every 4 hours as needed.  Patient will need a new albuterol inhaler prescription for home use.  Antibiotics are not indicated.  She  can likely go home this afternoon.  She was a full code.  She does use electronic cigarettes and smokes marijuana.  Vaping nicotine dependence, non-tobacco product Patient advised to stop vaping.  DVT prophylaxis: SCDs Code Status: Full Code Family Communication: no family at bedside  Disposition Plan: return home  Consults called: none  Admission status: Observation, Med-Surg   Carollee Herter, DO [facility] 01/11/2021, 6:07 AM

## 2021-01-11 NOTE — Progress Notes (Signed)
°  Patient transferred from [facility] ED.    01/11/21 0526  Vitals  Temp 98.6 F (37 C)  Temp Source Oral  BP (!) 148/84  MAP (mmHg) 99  BP Location Right Arm  BP Method Automatic  Patient Position (if appropriate) Lying  Pulse Rate 86  Pulse Rate Source Dinamap  Resp 18  MEWS COLOR  MEWS Score Color Green  Oxygen Therapy  SpO2 97 %  O2 Device Room Air

## 2021-01-12 LAB — PATHOLOGIST SMEAR REVIEW

## 2021-01-15 LAB — CULTURE, BLOOD (ROUTINE X 2)
Culture: NO GROWTH
Culture: NO GROWTH
Special Requests: ADEQUATE

## 2021-11-23 ENCOUNTER — Ambulatory Visit
Admission: EM | Admit: 2021-11-23 | Discharge: 2021-11-23 | Disposition: A | Discharge status: Home / Self Care | Payer: BC Managed Care – PPO | Attending: Internal Medicine | Admitting: Internal Medicine

## 2021-11-23 DIAGNOSIS — J4530 Mild persistent asthma, uncomplicated: Secondary | ICD-10-CM | POA: Diagnosis not present

## 2021-11-23 DIAGNOSIS — J018 Other acute sinusitis: Secondary | ICD-10-CM

## 2021-11-23 LAB — POCT RAPID STREP A (OFFICE): Rapid Strep A Screen: NEGATIVE

## 2021-11-23 MED ORDER — AMOXICILLIN-POT CLAVULANATE 875-125 MG PO TABS
1.0000 | ORAL_TABLET | Freq: Two times a day (BID) | ORAL | 0 refills | Status: AC
Start: 2021-11-23 — End: ?

## 2021-11-23 MED ORDER — CETIRIZINE HCL 10 MG PO TABS: 10.0000 mg | ORAL_TABLET | Freq: Every day | ORAL | 0 refills | Status: AC

## 2021-11-23 MED ORDER — ALBUTEROL SULFATE HFA 108 (90 BASE) MCG/ACT IN AERS: 2.0000 | INHALATION_SPRAY | Freq: Four times a day (QID) | RESPIRATORY_TRACT | 0 refills | Status: DC | PRN

## 2021-11-23 MED ORDER — TRIAMCINOLONE ACETONIDE 40 MG/ML IJ SUSP
40.0000 mg | Freq: Once | INTRAMUSCULAR | Status: AC
  Administered 2021-11-23: 40 mg via INTRAMUSCULAR

## 2021-11-23 MED ORDER — PROMETHAZINE-DM 6.25-15 MG/5ML PO SYRP: 5.0000 mL | ORAL_SOLUTION | Freq: Three times a day (TID) | ORAL | 0 refills | Status: DC | PRN

## 2021-11-23 NOTE — ED Provider Notes (Addendum)
[facility]  Note:  This document was prepared using Systems analyst and may include unintentional dictation errors.  MRN: [id number] DOB: 12-13-1985  Subjective:   Tracy Lynn is a 36 y.o. female presenting for 1 week history of persistent and worsening sinus congestion, sinus drainage, coughing, chest tightness and wheezing, shortness of breath.  Patient has been using over-the-counter medications with minimal relief.  Has a history of asthma and feels like her inhaler is not working.  Chart review shows that she is currently vaping daily, also uses marijuana.  She did some COVID test at home and were negative.  No current facility-administered medications for this encounter.  Current Outpatient Medications:    albuterol (PROVENTIL) (2.5 MG/3ML) 0.083% nebulizer solution, Take 3 mLs (2.5 mg total) by nebulization every 4 (four) hours as needed for wheezing or shortness of breath., Disp: 75 mL, Rfl: 11   albuterol (VENTOLIN HFA) 108 (90 Base) MCG/ACT inhaler, Inhale 2 puffs into the lungs every 6 (six) hours as needed for wheezing or shortness of breath., Disp: 8 g, Rfl: 11   dextromethorphan-guaiFENesin (MUCINEX DM) 30-600 MG 12hr tablet, Take 1 tablet by mouth 2 (two) times daily as needed for cough., Disp: , Rfl:    naproxen sodium (ALEVE) 220 MG tablet, Take 440 mg by mouth daily as needed (pain)., Disp: , Rfl:    predniSONE (DELTASONE) 20 MG tablet, Take 2 tablets (40 mg total) by mouth daily with breakfast., Disp: 10 tablet, Rfl: 0   Allergies  Allergen Reactions   Diphenhydramine Hcl Nausea And Vomiting   Latex Itching    Past Medical History:  Diagnosis Date   Asthma      Past Surgical History:  Procedure Laterality Date   CESAREAN SECTION N/A 09/14/2015   Procedure: CESAREAN SECTION;  Surgeon: Mora Bellman, MD;  Location: [place];  Service: Obstetrics;  Laterality: N/A;   NO PAST SURGERIES     WISDOM TOOTH  EXTRACTION      Family History  Problem Relation Age of Onset   Cancer Maternal Grandmother        colon   Hypertension Paternal Grandmother    Diabetes Paternal Grandmother     Social History   Tobacco Use   Smoking status: Former    Packs/day: 0.50    Types: Cigarettes    Quit date: 06/17/2013    Years since quitting: 8.4   Smokeless tobacco: Never  Vaping Use   Vaping Use: Every day  Substance Use Topics   Alcohol use: Yes    Alcohol/week: 8.0 standard drinks of alcohol    Types: 8 Standard drinks or equivalent per week    Comment: weekly   Drug use: Yes    Types: Marijuana    ROS   Objective:   Vitals: BP 129/83 (BP Location: Right Arm)   Pulse 95   Resp 18   SpO2 96%   Breastfeeding No   Physical Exam Constitutional:      General: She is not in acute distress.    Appearance: Normal appearance. She is well-developed and normal weight. She is not ill-appearing, toxic-appearing or diaphoretic.  HENT:     Head: Normocephalic and atraumatic.     Right Ear: Tympanic membrane, ear canal and external ear normal. No drainage or tenderness. No middle ear effusion. There is no impacted cerumen. Tympanic membrane is not erythematous or bulging.     Left Ear: Tympanic membrane, ear canal and external ear normal. No  drainage or tenderness.  No middle ear effusion. There is no impacted cerumen. Tympanic membrane is not erythematous or bulging.     Nose: Nose normal. No congestion or rhinorrhea.     Mouth/Throat:     Mouth: Mucous membranes are moist. No oral lesions.     Pharynx: No pharyngeal swelling, oropharyngeal exudate, posterior oropharyngeal erythema or uvula swelling.     Tonsils: No tonsillar exudate or tonsillar abscesses.  Eyes:     General: Lids are normal. Lids are everted, no foreign bodies appreciated. Vision grossly intact. No scleral icterus.       Right eye: No foreign body, discharge or hordeolum.        Left eye: No foreign body, discharge or  hordeolum.     Extraocular Movements: Extraocular movements intact.     Right eye: Normal extraocular motion.     Left eye: Normal extraocular motion and no nystagmus.     Conjunctiva/sclera:     Right eye: Right conjunctiva is not injected. No chemosis, exudate or hemorrhage.    Left eye: Left conjunctiva is not injected. Hemorrhage (subconjunctival very slight laterally) present. No chemosis or exudate. Cardiovascular:     Rate and Rhythm: Normal rate and regular rhythm.     Heart sounds: Normal heart sounds. No murmur heard.    No friction rub. No gallop.  Pulmonary:     Effort: Pulmonary effort is normal. No respiratory distress.     Breath sounds: No stridor. Wheezing (throughout) present. No rhonchi or rales.  Chest:     Chest wall: No tenderness.  Musculoskeletal:     Cervical back: Normal range of motion and neck supple.  Lymphadenopathy:     Cervical: No cervical adenopathy.  Skin:    General: Skin is warm and dry.  Neurological:     General: No focal deficit present.     Mental Status: She is alert and oriented to person, place, and time.  Psychiatric:        Mood and Affect: Mood normal.        Behavior: Behavior normal.    Results for orders placed or performed during the hospital encounter of 11/23/21 (from the past 24 hour(s))  POCT rapid strep A     Status: None   Collection Time: 11/23/21  9:32 AM  Result Value Ref Range   Rapid Strep A Screen Negative Negative   IM triamcinolone 40 mg in clinic.   Assessment and Plan :   PDMP not reviewed this encounter.  1. Other acute sinusitis, recurrence not specified   2. Mild persistent asthma without complication     Rapid strep test negative.  Start Augmentin to address sinusitis.  Recommended steroid use as above given her exam and asthma.  Use supportive care otherwise.  Given timeline of illness, deferred COVID and flu testing.  Counseled patient on potential for adverse effects with medications  prescribed/recommended today, ER and return-to-clinic precautions discussed, patient verbalized understanding.      Jaynee Eagles, [place] 11/23/21 [phone]

## 2021-11-23 NOTE — ED Triage Notes (Signed)
Pt c/o lt eye redness and drainage since this am. Used eye drops with little relief.   Pt c/o cough and congestion since last week and sore throat started yesterday.

## 2021-12-02 ENCOUNTER — Ambulatory Visit
Admission: EM | Admit: 2021-12-02 | Discharge: 2021-12-02 | Disposition: A | Discharge status: Home / Self Care | Payer: BC Managed Care – PPO | Attending: Urgent Care | Admitting: Urgent Care

## 2021-12-02 DIAGNOSIS — R070 Pain in throat: Secondary | ICD-10-CM | POA: Diagnosis present

## 2021-12-02 DIAGNOSIS — Z720 Tobacco use: Secondary | ICD-10-CM | POA: Diagnosis present

## 2021-12-02 DIAGNOSIS — J4520 Mild intermittent asthma, uncomplicated: Secondary | ICD-10-CM

## 2021-12-02 DIAGNOSIS — M542 Cervicalgia: Secondary | ICD-10-CM

## 2021-12-02 LAB — POCT RAPID STREP A (OFFICE): Rapid Strep A Screen: NEGATIVE

## 2021-12-02 MED ORDER — PREDNISONE 50 MG PO TABS
50.0000 mg | ORAL_TABLET | Freq: Every day | ORAL | 0 refills | Status: DC
Start: 2021-12-02 — End: 2022-06-12

## 2021-12-02 NOTE — ED Provider Notes (Signed)
[facility]  Note:  This document was prepared using Conservation officer, historic buildings and may include unintentional dictation errors.  MRN: [id number] DOB: 1985-02-10  Subjective:   Tracy Lynn is a 36 y.o. female presenting for recheck on throat pain.  Reports that her symptoms are more anterior around her trachea/thyroid.  Feels pain with swallowing still however.  Patient was last seen 11/23/2021.  Had a negative point-of-care rapid strep test.  She actually underwent a course of Augmentin for empiric treatment of sinusitis.  From that same visit she also received IM triamcinolone at 40 mg.  Has not been vaping since she has been sick.  No overt chest pain, shortness of breath or wheezing.  Patient would like to have more testing given that she continues to have some symptoms.  No current facility-administered medications for this encounter.  Current Outpatient Medications:    albuterol (PROVENTIL) (2.5 MG/3ML) 0.083% nebulizer solution, Take 3 mLs (2.5 mg total) by nebulization every 4 (four) hours as needed for wheezing or shortness of breath., Disp: 75 mL, Rfl: 11   albuterol (VENTOLIN HFA) 108 (90 Base) MCG/ACT inhaler, Inhale 2 puffs into the lungs every 6 (six) hours as needed for wheezing or shortness of breath., Disp: 18 g, Rfl: 0   amoxicillin-clavulanate (AUGMENTIN) 875-125 MG tablet, Take 1 tablet by mouth 2 (two) times daily., Disp: 14 tablet, Rfl: 0   cetirizine (ZYRTEC ALLERGY) 10 MG tablet, Take 1 tablet (10 mg total) by mouth daily., Disp: 30 tablet, Rfl: 0   naproxen sodium (ALEVE) 220 MG tablet, Take 440 mg by mouth daily as needed (pain)., Disp: , Rfl:    promethazine-dextromethorphan (PROMETHAZINE-DM) 6.25-15 MG/5ML syrup, Take 5 mLs by mouth 3 (three) times daily as needed for cough., Disp: 100 mL, Rfl: 0   Allergies  Allergen Reactions   Diphenhydramine Hcl Nausea And Vomiting   Latex Itching    Past Medical History:  Diagnosis Date    Asthma      Past Surgical History:  Procedure Laterality Date   CESAREAN SECTION N/A 09/14/2015   Procedure: CESAREAN SECTION;  Surgeon: Catalina Antigua, MD;  Location: [facility];  Service: Obstetrics;  Laterality: N/A;   NO PAST SURGERIES     WISDOM TOOTH EXTRACTION      Family History  Problem Relation Age of Onset   Cancer Maternal Grandmother        colon   Hypertension Paternal Grandmother    Diabetes Paternal Grandmother     Social History   Tobacco Use   Smoking status: Former    Packs/day: 0.50    Types: Cigarettes    Quit date: 06/17/2013    Years since quitting: 8.4   Smokeless tobacco: Never  Vaping Use   Vaping Use: Every day  Substance Use Topics   Alcohol use: Yes    Alcohol/week: 8.0 standard drinks of alcohol    Types: 8 Standard drinks or equivalent per week    Comment: weekly   Drug use: Yes    Types: Marijuana    ROS   Objective:   Vitals: BP 123/83 (BP Location: Right Arm)   Pulse 84   Temp 98.5 F (36.9 C) (Oral)   Resp 18   SpO2 96%   Physical Exam Constitutional:      General: She is not in acute distress.    Appearance: Normal appearance. She is well-developed. She is not ill-appearing, toxic-appearing or diaphoretic.  HENT:     Head: Normocephalic  and atraumatic.     Nose: Nose normal.     Mouth/Throat:     Mouth: Mucous membranes are moist.     Pharynx: No pharyngeal swelling, oropharyngeal exudate, posterior oropharyngeal erythema or uvula swelling.     Tonsils: No tonsillar exudate or tonsillar abscesses. 0 on the right. 0 on the left.  Eyes:     General: No scleral icterus.       Right eye: No discharge.        Left eye: No discharge.     Extraocular Movements: Extraocular movements intact.  Neck:     Thyroid: Thyroid tenderness present. No thyroid mass or thyromegaly.  Cardiovascular:     Rate and Rhythm: Normal rate and regular rhythm.     Heart sounds: Normal heart sounds. No murmur heard.    No friction  rub. No gallop.  Pulmonary:     Effort: Pulmonary effort is normal. No respiratory distress.     Breath sounds: No stridor. No wheezing, rhonchi or rales.  Chest:     Chest wall: No tenderness.  Lymphadenopathy:     Cervical: No cervical adenopathy.     Right cervical: No superficial, deep or posterior cervical adenopathy.    Left cervical: No superficial, deep or posterior cervical adenopathy.  Skin:    General: Skin is warm and dry.  Neurological:     General: No focal deficit present.     Mental Status: She is alert and oriented to person, place, and time.  Psychiatric:        Mood and Affect: Mood normal.        Behavior: Behavior normal.     Results for orders placed or performed during the hospital encounter of 12/02/21 (from the past 24 hour(s))  POCT rapid strep A     Status: None   Collection Time: 12/02/21  5:26 PM  Result Value Ref Range   Rapid Strep A Screen Negative Negative   Assessment and Plan :   PDMP not reviewed this encounter.  1. Mild intermittent asthma without complication   2. Throat pain   3. Neck pain   4. Vapes nicotine containing substance     Rapid strep test negative, strep culture pending.  Counseled on the possibility of lingering pharyngitis, sinusitis, postnasal drainage from allergic rhinitis or even possibly subacute thyroiditis.  The latter is possible given her recent illness.  Recommended supplementing her treatment with an oral prednisone course given the negative strep test. Deferred imaging given clear cardiopulmonary exam, hemodynamically stable vital signs.  Labs pending, will follow up with results as soon as possible. Counseled patient on potential for adverse effects with medications prescribed/recommended today, ER and return-to-clinic precautions discussed, patient verbalized understanding.    Wallis Bamberg, [place] 12/02/21 [id number]

## 2021-12-02 NOTE — ED Triage Notes (Signed)
Pt was seen last week with sinus infection. Was tested for strep last week and reports test was negative. Patient still c/o of sore throat and pain when swallowing. States she has completed her previous prescribed medication.

## 2021-12-03 LAB — CYTOLOGY, (ORAL, ANAL, URETHRAL) ANCILLARY ONLY
Chlamydia: NEGATIVE
Comment: NEGATIVE
Comment: NEGATIVE
Comment: NORMAL
Neisseria Gonorrhea: NEGATIVE
Trichomonas: NEGATIVE

## 2021-12-03 LAB — T3, FREE: T3, Free: 3 pg/mL (ref 2.0–4.4)

## 2021-12-03 LAB — TSH: TSH: 1.88 u[IU]/mL (ref 0.450–4.500)

## 2021-12-03 LAB — T4, FREE: Free T4: 0.92 ng/dL (ref 0.82–1.77)

## 2021-12-05 LAB — CULTURE, GROUP A STREP (THRC)

## 2022-02-02 DIAGNOSIS — Z113 Encounter for screening for infections with a predominantly sexual mode of transmission: Secondary | ICD-10-CM | POA: Diagnosis not present

## 2022-02-02 DIAGNOSIS — Z30431 Encounter for routine checking of intrauterine contraceptive device: Secondary | ICD-10-CM | POA: Diagnosis not present

## 2022-03-29 ENCOUNTER — Ambulatory Visit
Admission: EM | Admit: 2022-03-29 | Discharge: 2022-03-29 | Disposition: A | Discharge status: Home / Self Care | Payer: Medicaid Other | Attending: Urgent Care | Admitting: Urgent Care

## 2022-03-29 DIAGNOSIS — H1013 Acute atopic conjunctivitis, bilateral: Secondary | ICD-10-CM

## 2022-03-29 DIAGNOSIS — H109 Unspecified conjunctivitis: Secondary | ICD-10-CM

## 2022-03-29 DIAGNOSIS — B9689 Other specified bacterial agents as the cause of diseases classified elsewhere: Secondary | ICD-10-CM

## 2022-03-29 MED ORDER — TOBRAMYCIN-DEXAMETHASONE 0.3-0.1 % OP SUSP
1.0000 [drp] | OPHTHALMIC | 0 refills | Status: AC
Start: 2022-03-29 — End: ?

## 2022-03-29 NOTE — ED Triage Notes (Signed)
2 weeks Pt removed false lashes 2 weeks ago and c/o right eye painful and yesterday left eye started with Bilateral eye redness and running with some yellow drainage. Yesterday Dry throat, runny nose, eyes. No F. Home remedies: eye drops, saline wash, compresses.

## 2022-03-29 NOTE — Discharge Instructions (Addendum)
Start tobramycin-dexamethasone eye drops. This is a combination antibiotic steroid medication. Use a total of 1 week's worth of medicine and then stop. Follow up with an eye doctor as soon as possible.

## 2022-03-29 NOTE — ED Provider Notes (Signed)
[facility]  Note:  This document was prepared using Systems analyst and may include unintentional dictation errors.  MRN: [id number] DOB: 20-Nov-1985  Subjective:   Tracy Lynn is a 37 y.o. female presenting for 2-week history of persistent and worsening bilateral eye irritation, redness, drainage now significantly more painful in the past day.  Symptoms started from having false eyelashes 2 weeks ago.  Has used multiple over-the-counter eye drops and compresses.  No current facility-administered medications for this encounter.  Current Outpatient Medications:    albuterol (PROVENTIL) (2.5 MG/3ML) 0.083% nebulizer solution, Take 3 mLs (2.5 mg total) by nebulization every 4 (four) hours as needed for wheezing or shortness of breath., Disp: 75 mL, Rfl: 11   albuterol (VENTOLIN HFA) 108 (90 Base) MCG/ACT inhaler, Inhale 2 puffs into the lungs every 6 (six) hours as needed for wheezing or shortness of breath., Disp: 18 g, Rfl: 0   amoxicillin-clavulanate (AUGMENTIN) 875-125 MG tablet, Take 1 tablet by mouth 2 (two) times daily., Disp: 14 tablet, Rfl: 0   cetirizine (ZYRTEC ALLERGY) 10 MG tablet, Take 1 tablet (10 mg total) by mouth daily., Disp: 30 tablet, Rfl: 0   naproxen sodium (ALEVE) 220 MG tablet, Take 440 mg by mouth daily as needed (pain)., Disp: , Rfl:    predniSONE (DELTASONE) 50 MG tablet, Take 1 tablet (50 mg total) by mouth daily with breakfast., Disp: 5 tablet, Rfl: 0   promethazine-dextromethorphan (PROMETHAZINE-DM) 6.25-15 MG/5ML syrup, Take 5 mLs by mouth 3 (three) times daily as needed for cough., Disp: 100 mL, Rfl: 0   Allergies  Allergen Reactions   Diphenhydramine Hcl Nausea And Vomiting   Latex Itching    Past Medical History:  Diagnosis Date   Asthma      Past Surgical History:  Procedure Laterality Date   CESAREAN SECTION N/A 09/14/2015   Procedure: CESAREAN SECTION;  Surgeon: Mora Bellman, MD;  Location: [place];  Service: Obstetrics;  Laterality: N/A;   NO PAST SURGERIES     WISDOM TOOTH EXTRACTION      Family History  Problem Relation Age of Onset   Cancer Maternal Grandmother        colon   Hypertension Paternal Grandmother    Diabetes Paternal Grandmother     Social History   Tobacco Use   Smoking status: Former    Packs/day: 0.50    Types: Cigarettes    Quit date: 06/17/2013    Years since quitting: 8.7   Smokeless tobacco: Never  Vaping Use   Vaping Use: Every day  Substance Use Topics   Alcohol use: Yes    Alcohol/week: 8.0 standard drinks of alcohol    Types: 8 Standard drinks or equivalent per week    Comment: weekly   Drug use: Yes    Types: Marijuana    ROS   Objective:   Vitals: BP 127/78 (BP Location: Right Arm)   Pulse (!) 102   Temp 98.5 F (36.9 C) (Oral)   Resp 18   Physical Exam Constitutional:      General: She is not in acute distress.    Appearance: Normal appearance. She is well-developed. She is not ill-appearing, toxic-appearing or diaphoretic.  HENT:     Head: Normocephalic and atraumatic.     Nose: Nose normal.     Mouth/Throat:     Mouth: Mucous membranes are moist.  Eyes:     General: Lids are normal. Lids are everted, no foreign bodies appreciated.  Vision grossly intact. No scleral icterus.       Right eye: Discharge present. No foreign body or hordeolum.        Left eye: Discharge present.No foreign body or hordeolum.     Extraocular Movements: Extraocular movements intact.     Right eye: Normal extraocular motion.     Left eye: Normal extraocular motion and no nystagmus.     Conjunctiva/sclera:     Right eye: Right conjunctiva is injected. No chemosis, exudate or hemorrhage.    Left eye: Left conjunctiva is injected. No chemosis, exudate or hemorrhage.    Pupils: Pupils are equal, round, and reactive to light.  Cardiovascular:     Rate and Rhythm: Normal rate.  Pulmonary:     Effort: Pulmonary effort is normal.   Skin:    General: Skin is warm and dry.  Neurological:     General: No focal deficit present.     Mental Status: She is alert and oriented to person, place, and time.  Psychiatric:        Mood and Affect: Mood normal.        Behavior: Behavior normal.     Assessment and Plan :   PDMP not reviewed this encounter.  1. Allergic conjunctivitis of both eyes   2. Bacterial conjunctivitis of both eyes     Will cover for an allergic conjunctivitis of both eyes and secondary bacterial conjunctivitis with TobraDex.  Discussed appropriate use of topical steroids for the eyes.  Recommended very close follow-up with an ophthalmologist and provided her with information for this. Counseled patient on potential for adverse effects with medications prescribed/recommended today, ER and return-to-clinic precautions discussed, patient verbalized understanding.    Jaynee Eagles, [place] 03/29/22 [id number]

## 2022-06-12 ENCOUNTER — Ambulatory Visit (INDEPENDENT_AMBULATORY_CARE_PROVIDER_SITE_OTHER): Payer: Managed Care, Other (non HMO)

## 2022-06-12 ENCOUNTER — Ambulatory Visit
Admission: EM | Admit: 2022-06-12 | Discharge: 2022-06-12 | Disposition: A | Discharge status: Home / Self Care | Payer: Medicaid Other

## 2022-06-12 DIAGNOSIS — J209 Acute bronchitis, unspecified: Secondary | ICD-10-CM | POA: Diagnosis not present

## 2022-06-12 DIAGNOSIS — R062 Wheezing: Secondary | ICD-10-CM

## 2022-06-12 MED ORDER — IPRATROPIUM-ALBUTEROL 0.5-2.5 (3) MG/3ML IN SOLN
3.0000 mL | Freq: Once | RESPIRATORY_TRACT | Status: AC
  Administered 2022-06-12: 3 mL via RESPIRATORY_TRACT

## 2022-06-12 MED ORDER — ALBUTEROL SULFATE HFA 108 (90 BASE) MCG/ACT IN AERS: 2.0000 | INHALATION_SPRAY | Freq: Four times a day (QID) | RESPIRATORY_TRACT | 0 refills | Status: DC | PRN

## 2022-06-12 MED ORDER — PREDNISONE 50 MG PO TABS: 50.0000 mg | ORAL_TABLET | Freq: Every day | ORAL | 0 refills | Status: DC

## 2022-06-12 MED ORDER — PROMETHAZINE-DM 6.25-15 MG/5ML PO SYRP: 5.0000 mL | ORAL_SOLUTION | Freq: Three times a day (TID) | ORAL | 0 refills | Status: AC | PRN

## 2022-06-12 NOTE — ED Triage Notes (Signed)
Pt reports sinus pressure, cough, wheezing x 2-3 days.

## 2022-06-12 NOTE — ED Provider Notes (Signed)
[facility]  Note:  This document was prepared using Conservation officer, historic buildings and may include unintentional dictation errors.  MRN: [id number] DOB: 1985/07/25  Subjective:   [place] Zogg is a 37 y.o. female presenting for 3-day history of sinus pressure, coughing, wheezing, chest tightness.  Needs a refill of her asthma inhaler.  Denies current smoking.  Chart review does show history of vaping, marijuana use.  Does not want a COVID test.  No current facility-administered medications for this encounter.  Current Outpatient Medications:    albuterol (PROVENTIL) (2.5 MG/3ML) 0.083% nebulizer solution, Take 3 mLs (2.5 mg total) by nebulization every 4 (four) hours as needed for wheezing or shortness of breath., Disp: 75 mL, Rfl: 11   albuterol (VENTOLIN HFA) 108 (90 Base) MCG/ACT inhaler, Inhale 2 puffs into the lungs every 6 (six) hours as needed for wheezing or shortness of breath., Disp: 18 g, Rfl: 0   amoxicillin-clavulanate (AUGMENTIN) 875-125 MG tablet, Take 1 tablet by mouth 2 (two) times daily., Disp: 14 tablet, Rfl: 0   cetirizine (ZYRTEC ALLERGY) 10 MG tablet, Take 1 tablet (10 mg total) by mouth daily., Disp: 30 tablet, Rfl: 0   naproxen sodium (ALEVE) 220 MG tablet, Take 440 mg by mouth daily as needed (pain)., Disp: , Rfl:    predniSONE (DELTASONE) 50 MG tablet, Take 1 tablet (50 mg total) by mouth daily with breakfast., Disp: 5 tablet, Rfl: 0   promethazine-dextromethorphan (PROMETHAZINE-DM) 6.25-15 MG/5ML syrup, Take 5 mLs by mouth 3 (three) times daily as needed for cough., Disp: 100 mL, Rfl: 0   tobramycin-dexamethasone (TOBRADEX) ophthalmic solution, Place 1 drop into both eyes every 4 (four) hours while awake., Disp: 5 mL, Rfl: 0   Allergies  Allergen Reactions   Diphenhydramine Hcl Nausea And Vomiting   Latex Itching    Past Medical History:  Diagnosis Date   Asthma      Past Surgical History:  Procedure Laterality Date    CESAREAN SECTION N/A 09/14/2015   Procedure: CESAREAN SECTION;  Surgeon: Catalina Antigua, MD;  Location: [facility];  Service: Obstetrics;  Laterality: N/A;   NO PAST SURGERIES     WISDOM TOOTH EXTRACTION      Family History  Problem Relation Age of Onset   Cancer Maternal Grandmother        colon   Hypertension Paternal Grandmother    Diabetes Paternal Grandmother     Social History   Tobacco Use   Smoking status: Former    Packs/day: .5    Types: Cigarettes    Quit date: 06/17/2013    Years since quitting: 8.9   Smokeless tobacco: Never  Vaping Use   Vaping Use: Every day  Substance Use Topics   Alcohol use: Yes    Alcohol/week: 8.0 standard drinks of alcohol    Types: 8 Standard drinks or equivalent per week    Comment: weekly   Drug use: Yes    Types: Marijuana    ROS   Objective:   Vitals: BP 139/84 (BP Location: Right Arm)   Pulse 81   Temp 98.3 F (36.8 C) (Oral)   Resp 20   SpO2 93%   Physical Exam Constitutional:      General: She is not in acute distress.    Appearance: Normal appearance. She is well-developed and normal weight. She is not ill-appearing, toxic-appearing or diaphoretic.  HENT:     Head: Normocephalic and atraumatic.     Right Ear: Tympanic membrane, ear  canal and external ear normal. No drainage or tenderness. No middle ear effusion. There is no impacted cerumen. Tympanic membrane is not erythematous or bulging.     Left Ear: Tympanic membrane, ear canal and external ear normal. No drainage or tenderness.  No middle ear effusion. There is no impacted cerumen. Tympanic membrane is not erythematous or bulging.     Nose: Nose normal. No congestion or rhinorrhea.     Mouth/Throat:     Mouth: Mucous membranes are moist. No oral lesions.     Pharynx: No pharyngeal swelling, oropharyngeal exudate, posterior oropharyngeal erythema or uvula swelling.     Tonsils: No tonsillar exudate or tonsillar abscesses.  Eyes:     General: No  scleral icterus.       Right eye: No discharge.        Left eye: No discharge.     Extraocular Movements: Extraocular movements intact.     Right eye: Normal extraocular motion.     Left eye: Normal extraocular motion.     Conjunctiva/sclera: Conjunctivae normal.  Cardiovascular:     Rate and Rhythm: Normal rate and regular rhythm.     Heart sounds: Normal heart sounds. No murmur heard.    No friction rub. No gallop.  Pulmonary:     Effort: Pulmonary effort is normal. No respiratory distress.     Breath sounds: No stridor. Wheezing (diffuse) present. No rhonchi or rales.  Chest:     Chest wall: No tenderness.  Musculoskeletal:     Cervical back: Normal range of motion and neck supple.  Lymphadenopathy:     Cervical: No cervical adenopathy.  Skin:    General: Skin is warm and dry.  Neurological:     General: No focal deficit present.     Mental Status: She is alert and oriented to person, place, and time.  Psychiatric:        Mood and Affect: Mood normal.        Behavior: Behavior normal.    A 0.5-2.5 mg ipratropium albuterol nebulizer treatment was administered in clinic.  Following the treatment, her lung sounds were dramatically improved.  Assessment and Plan :   PDMP not reviewed this encounter.  1. Acute bronchitis, unspecified organism   2. Wheezing    Recommended managing for bronchitis with supportive care, prednisone.  Refilled her albuterol inhaler.  Patient declined a COVID test. Deferred imaging given clear cardiopulmonary exam, hemodynamically stable vital signs. Counseled patient on potential for adverse effects with medications prescribed/recommended today, ER and return-to-clinic precautions discussed, patient verbalized understanding.    Wallis Bamberg, [place] 06/12/22 [id number]

## 2023-02-07 ENCOUNTER — Ambulatory Visit
Admission: EM | Admit: 2023-02-07 | Discharge: 2023-02-07 | Disposition: A | Discharge status: Home / Self Care | Payer: Managed Care, Other (non HMO) | Attending: Emergency Medicine | Admitting: Emergency Medicine

## 2023-02-07 ENCOUNTER — Other Ambulatory Visit: Payer: Self-pay

## 2023-02-07 DIAGNOSIS — S83242A Other tear of medial meniscus, current injury, left knee, initial encounter: Secondary | ICD-10-CM | POA: Diagnosis not present

## 2023-02-07 DIAGNOSIS — S8992XA Unspecified injury of left lower leg, initial encounter: Secondary | ICD-10-CM

## 2023-02-07 MED ORDER — IBUPROFEN 800 MG PO TABS: 800.0000 mg | ORAL_TABLET | Freq: Three times a day (TID) | ORAL | 0 refills | Status: AC

## 2023-02-07 NOTE — ED Triage Notes (Signed)
Patient C/O pain left knee. Patient states she was walking and felt a "pop" and has had pain since. Patient is ambulatory and states increased p[ain with walking and movement.

## 2023-02-07 NOTE — ED Provider Notes (Signed)
[facility]    CSN: [id number] Arrival date & time: 02/07/23  1743      History   Chief Complaint Chief Complaint  Patient presents with   Knee Pain    HPI Tracy Lynn is a 38 y.o. female.  Here with left knee injury that occurred this afternoon She was walking down the steps and felt/heard a loud "pop".  Since then having pain with movement and weightbearing.  She is able to bear weight although causes 9/10 pain No interventions yet No prior injury known No direct trauma or fall  Past Medical History:  Diagnosis Date   Asthma     Patient Active Problem List   Diagnosis Date Noted   Vaping nicotine dependence, non-tobacco product 01/11/2021   Asthma, moderate persistent 01/11/2021    Past Surgical History:  Procedure Laterality Date   CESAREAN SECTION N/A 09/14/2015   Procedure: CESAREAN SECTION;  Surgeon: Catalina Antigua, MD;  Location: [facility];  Service: Obstetrics;  Laterality: N/A;   NO PAST SURGERIES     WISDOM TOOTH EXTRACTION      OB History     Gravida  2   Para  1   Term  1   Preterm      AB  1   Living  0      SAB  1   IAB      Ectopic      Multiple  0   Live Births               Home Medications    Prior to Admission medications   Medication Sig Start Date End Date Taking? Authorizing Provider  albuterol (PROVENTIL) (2.5 MG/3ML) 0.083% nebulizer solution Take 3 mLs (2.5 mg total) by nebulization every 4 (four) hours as needed for wheezing or shortness of breath. 01/11/21 02/07/23 Yes Danford, Earl Lites, MD  albuterol (VENTOLIN HFA) 108 (90 Base) MCG/ACT inhaler Inhale 2 puffs into the lungs every 6 (six) hours as needed for wheezing or shortness of breath. 06/12/22  Yes Wallis Bamberg, PA-C  ibuprofen (ADVIL) 800 MG tablet Take 1 tablet (800 mg total) by mouth 3 (three) times daily. 02/07/23  Yes Sequoyah Counterman, Lurena Joiner, PA-C  levonorgestrel (MIRENA) 20 MCG/DAY IUD 1 each by Intrauterine route once.   Yes  [provider]  amoxicillin-clavulanate (AUGMENTIN) 875-125 MG tablet Take 1 tablet by mouth 2 (two) times daily. 11/23/21   Wallis Bamberg, PA-C  cetirizine (ZYRTEC ALLERGY) 10 MG tablet Take 1 tablet (10 mg total) by mouth daily. 11/23/21   Wallis Bamberg, PA-C  naproxen sodium (ALEVE) 220 MG tablet Take 440 mg by mouth daily as needed (pain).    [provider]  predniSONE (DELTASONE) 50 MG tablet Take 1 tablet (50 mg total) by mouth daily with breakfast. 06/12/22   Wallis Bamberg, PA-C  promethazine-dextromethorphan (PROMETHAZINE-DM) 6.25-15 MG/5ML syrup Take 5 mLs by mouth 3 (three) times daily as needed for cough. 06/12/22   Wallis Bamberg, PA-C  tobramycin-dexamethasone [facility] ophthalmic solution Place 1 drop into both eyes every 4 (four) hours while awake. 03/29/22   Wallis Bamberg, PA-C    Family History Family History  Problem Relation Age of Onset   Cancer Maternal Grandmother        colon   Hypertension Paternal Grandmother    Diabetes Paternal Grandmother     Social History Social History   Tobacco Use   Smoking status: Former    Current packs/day: 0.00    Types: Cigarettes  Quit date: 06/17/2013    Years since quitting: 9.6   Smokeless tobacco: Never  Vaping Use   Vaping status: Every Day  Substance Use Topics   Alcohol use: Yes    Alcohol/week: 8.0 standard drinks of alcohol    Types: 8 Standard drinks or equivalent per week    Comment: weekly   Drug use: Yes    Types: Marijuana     Allergies   Diphenhydramine hcl, Latex, and Penicillins   Review of Systems Review of Systems Per HPI  Physical Exam Triage Vital Signs ED Triage Vitals  Encounter Vitals Group     BP 02/07/23 1847 131/83     Systolic BP Percentile --      Diastolic BP Percentile --      Pulse Rate 02/07/23 1847 74     Resp --      Temp 02/07/23 1847 97.9 F (36.6 C)     Temp Source 02/07/23 1847 Oral     SpO2 02/07/23 1847 97 %     Weight --      Height --      Head  Circumference --      Peak Flow --      Pain Score 02/07/23 1851 9     Pain Loc --      Pain Education --      Exclude from Growth Chart --    No data found.  Updated Vital Signs BP 131/83 (BP Location: Right Arm)   Pulse 74   Temp 97.9 F (36.6 C) (Oral)   SpO2 97%    Physical Exam Vitals and nursing note reviewed.  Constitutional:      General: She is not in acute distress. HENT:     Mouth/Throat:     Pharynx: Oropharynx is clear.  Cardiovascular:     Rate and Rhythm: Normal rate and regular rhythm.     Pulses: Normal pulses.  Pulmonary:     Effort: Pulmonary effort is normal.  Musculoskeletal:     Cervical back: Normal range of motion.     Right knee: No swelling, deformity or bony tenderness. Decreased range of motion. No tenderness.     Comments: Pain with ROM. No bony tenderness or obvious swelling, deformity.  DP pulse 2+, sensation intact distally  Skin:    Capillary Refill: Capillary refill takes less than 2 seconds.  Neurological:     Mental Status: She is alert and oriented to person, place, and time.     UC Treatments / Results  Labs (all labs ordered are listed, but only abnormal results are displayed) Labs Reviewed - No data to display  EKG   Radiology No results found.  Procedures Procedures (including critical care time)  Medications Ordered in UC Medications - No data to display  Initial Impression / Assessment and Plan / UC Course  I have reviewed the triage vital signs and the nursing notes.  Pertinent labs & imaging results that were available during my care of the patient were reviewed by me and considered in my medical decision making (see chart for details).  Without trauma or fall I do not suspect bony abnormality at this time.  Discussed with patient she likely has a soft tissue injury such as muscle, ligament or meniscus.  Recommend following closely with orthopedics.  For now discussed RICE therapy and using ibuprofen for pain.   A knee brace is provided in clinic.  2 orthopedic clinic contact information are given.  Patient feels less  pain with weightbearing while using brace  Final Clinical Impressions(s) / UC Diagnoses   Final diagnoses:  Injury of left knee, initial encounter     Discharge Instructions      Rest - try to avoid heavy lifting and high impact activity Ice - apply for 20 minutes a few times daily Compression - use knee brace for support Elevation - prop up on a pillow Ibuprofen - can be used every 6 hours  Please follow up with orthopedics when you are able    ED Prescriptions     Medication Sig Dispense Auth. Provider   ibuprofen (ADVIL) 800 MG tablet Take 1 tablet (800 mg total) by mouth 3 (three) times daily. 21 tablet Almer Littleton, Lurena Joiner, PA-C      PDMP not reviewed this encounter.   Kathrine Haddock 02/07/23 2043

## 2023-02-07 NOTE — Discharge Instructions (Addendum)
Rest - try to avoid heavy lifting and high impact activity Ice - apply for 20 minutes a few times daily Compression - use knee brace for support Elevation - prop up on a pillow Ibuprofen - can be used every 6 hours  Please follow up with orthopedics when you are able

## 2023-02-14 ENCOUNTER — Ambulatory Visit
Admission: EM | Admit: 2023-02-14 | Discharge: 2023-02-14 | Disposition: A | Discharge status: Home / Self Care | Payer: 59 | Attending: Family Medicine | Admitting: Family Medicine

## 2023-02-14 DIAGNOSIS — J4541 Moderate persistent asthma with (acute) exacerbation: Secondary | ICD-10-CM | POA: Diagnosis not present

## 2023-02-14 DIAGNOSIS — B349 Viral infection, unspecified: Secondary | ICD-10-CM | POA: Diagnosis not present

## 2023-02-14 LAB — POC COVID19/FLU A&B COMBO
Covid Antigen, POC: NEGATIVE
Influenza A Antigen, POC: NEGATIVE
Influenza B Antigen, POC: NEGATIVE

## 2023-02-14 MED ORDER — METHYLPREDNISOLONE ACETATE 80 MG/ML IJ SUSP
80.0000 mg | Freq: Once | INTRAMUSCULAR | Status: AC
  Administered 2023-02-14: 80 mg via INTRAMUSCULAR

## 2023-02-14 MED ORDER — PROMETHAZINE-DM 6.25-15 MG/5ML PO SYRP: 5.0000 mL | ORAL_SOLUTION | Freq: Three times a day (TID) | ORAL | 0 refills | Status: AC | PRN

## 2023-02-14 MED ORDER — ALBUTEROL SULFATE HFA 108 (90 BASE) MCG/ACT IN AERS: 2.0000 | INHALATION_SPRAY | Freq: Four times a day (QID) | RESPIRATORY_TRACT | 0 refills | Status: AC | PRN

## 2023-02-14 MED ORDER — IPRATROPIUM-ALBUTEROL 0.5-2.5 (3) MG/3ML IN SOLN
3.0000 mL | Freq: Once | RESPIRATORY_TRACT | Status: AC
  Administered 2023-02-14: 3 mL via RESPIRATORY_TRACT

## 2023-02-14 MED ORDER — IPRATROPIUM-ALBUTEROL 0.5-2.5 (3) MG/3ML IN SOLN: 3.0000 mL | RESPIRATORY_TRACT | 0 refills | Status: AC | PRN

## 2023-02-14 NOTE — Discharge Instructions (Addendum)
I will call you with your COVID flu test results. Schedule your duo nebulizer treatments at home once every 4 hours.  You have received an injection of a steroid that should help you with your breathing.  However if you see that your oxygen levels are dropping below 90% and stay there please go to the emergency room.

## 2023-02-14 NOTE — ED Triage Notes (Addendum)
Pt c/o cough, SHOB, wheezing x 2 hours-states no relief with inhaler-pt with DOE/auditory wheezing noted-denies CP as noted c/o

## 2023-02-14 NOTE — ED Provider Notes (Signed)
[facility]  Note:  This document was prepared using Conservation officer, historic buildings and may include unintentional dictation errors.  MRN: [id number] DOB: [birth year]  Subjective:   Tracy Lynn is a 38 y.o. female presenting for acute onset of cough, shob, wheezing.  Has been using her albuterol inhaler and feels like her breathing is improved now that she is in the clinic but wanted to be evaluated.  Patient does vape.  Has a nebulizer machine but needs nebulizer solution.   Current Facility-Administered Medications:    ipratropium-albuterol (DUONEB) 0.5-2.5 (3) MG/3ML nebulizer solution 3 mL, 3 mL, Nebulization, Once, Wallis Bamberg, PA-C  Current Outpatient Medications:    albuterol (PROVENTIL) (2.5 MG/3ML) 0.083% nebulizer solution, Take 3 mLs (2.5 mg total) by nebulization every 4 (four) hours as needed for wheezing or shortness of breath., Disp: 75 mL, Rfl: 11   albuterol (VENTOLIN HFA) 108 (90 Base) MCG/ACT inhaler, Inhale 2 puffs into the lungs every 6 (six) hours as needed for wheezing or shortness of breath., Disp: 18 g, Rfl: 0   amoxicillin-clavulanate (AUGMENTIN) 875-125 MG tablet, Take 1 tablet by mouth 2 (two) times daily., Disp: 14 tablet, Rfl: 0   cetirizine (ZYRTEC ALLERGY) 10 MG tablet, Take 1 tablet (10 mg total) by mouth daily., Disp: 30 tablet, Rfl: 0   ibuprofen (ADVIL) 800 MG tablet, Take 1 tablet (800 mg total) by mouth 3 (three) times daily., Disp: 21 tablet, Rfl: 0   levonorgestrel (MIRENA) 20 MCG/DAY IUD, 1 each by Intrauterine route once., Disp: , Rfl:    naproxen sodium (ALEVE) 220 MG tablet, Take 440 mg by mouth daily as needed (pain)., Disp: , Rfl:    predniSONE (DELTASONE) 50 MG tablet, Take 1 tablet (50 mg total) by mouth daily with breakfast., Disp: 5 tablet, Rfl: 0   promethazine-dextromethorphan (PROMETHAZINE-DM) 6.25-15 MG/5ML syrup, Take 5 mLs by mouth 3 (three) times daily as needed for cough., Disp: 200 mL, Rfl: 0    tobramycin-dexamethasone (TOBRADEX) ophthalmic solution, Place 1 drop into both eyes every 4 (four) hours while awake., Disp: 5 mL, Rfl: 0   Allergies  Allergen Reactions   Diphenhydramine Hcl Nausea And Vomiting   Latex Itching   Penicillins Other (See Comments)    Patient states she is unsure of previous reaction    Past Medical History:  Diagnosis Date   Asthma      Past Surgical History:  Procedure Laterality Date   CESAREAN SECTION N/A 09/14/2015   Procedure: CESAREAN SECTION;  Surgeon: Catalina Antigua, MD;  Location: [facility];  Service: Obstetrics;  Laterality: N/A;   NO PAST SURGERIES     WISDOM TOOTH EXTRACTION      Family History  Problem Relation Age of Onset   Cancer Maternal Grandmother        colon   Hypertension Paternal Grandmother    Diabetes Paternal Grandmother     Social History   Tobacco Use   Smoking status: Former    Current packs/day: 0.00    Types: Cigarettes    Quit date: 06/17/2013    Years since quitting: 9.6   Smokeless tobacco: Never  Vaping Use   Vaping status: Every Day  Substance Use Topics   Alcohol use: Yes    Alcohol/week: 8.0 standard drinks of alcohol    Types: 8 Standard drinks or equivalent per week    Comment: weekly   Drug use: Yes    Types: Marijuana    ROS   Objective:  Vitals: Pulse (!) 108   Temp 99.3 F (37.4 C) (Oral)   Resp (!) 28   SpO2 (!) 89%   Physical Exam Constitutional:      General: She is not in acute distress.    Appearance: Normal appearance. She is well-developed and normal weight. She is not ill-appearing, toxic-appearing or diaphoretic.  HENT:     Head: Normocephalic and atraumatic.     Right Ear: Tympanic membrane, ear canal and external ear normal. No drainage or tenderness. No middle ear effusion. There is no impacted cerumen. Tympanic membrane is not erythematous or bulging.     Left Ear: Tympanic membrane, ear canal and external ear normal. No drainage or tenderness.  No  middle ear effusion. There is no impacted cerumen. Tympanic membrane is not erythematous or bulging.     Nose: Nose normal. No congestion or rhinorrhea.     Mouth/Throat:     Mouth: Mucous membranes are moist. No oral lesions.     Pharynx: No pharyngeal swelling, oropharyngeal exudate, posterior oropharyngeal erythema or uvula swelling.     Tonsils: No tonsillar exudate or tonsillar abscesses.  Eyes:     General: No scleral icterus.       Right eye: No discharge.        Left eye: No discharge.     Extraocular Movements: Extraocular movements intact.     Right eye: Normal extraocular motion.     Left eye: Normal extraocular motion.     Conjunctiva/sclera: Conjunctivae normal.  Cardiovascular:     Rate and Rhythm: Normal rate and regular rhythm.     Heart sounds: Normal heart sounds. No murmur heard.    No friction rub. No gallop.  Pulmonary:     Effort: Pulmonary effort is normal. No respiratory distress.     Breath sounds: No stridor. Wheezing and rhonchi present. No rales.  Chest:     Chest wall: No tenderness.  Musculoskeletal:     Cervical back: Normal range of motion and neck supple.  Lymphadenopathy:     Cervical: No cervical adenopathy.  Skin:    General: Skin is warm and dry.  Neurological:     General: No focal deficit present.     Mental Status: She is alert and oriented to person, place, and time.  Psychiatric:        Mood and Affect: Mood normal.        Behavior: Behavior normal.    Nebulizer treatment of ipratropium albuterol 0.5 mg - 2.5 mg was administered in clinic.  This improved her pulse oximetry to 93%.  IM Depo-Medrol 80 mg administered in clinic.  Rapid COVID and flu A, flu B testing was negative.  Assessment and Plan :   PDMP not reviewed this encounter.  1. Acute viral syndrome   2. Moderate persistent asthma with (acute) exacerbation    IM steroids as above.  Schedule nebulized albuterol.  Maintain strict ER precautions for respiratory failure,  respiratory distress, hypoxia.  Counseled patient on potential for adverse effects with medications prescribed/recommended today, ER and return-to-clinic precautions discussed, patient verbalized understanding.    Wallis Bamberg, [place] 02/14/23 4098

## 2023-02-15 ENCOUNTER — Emergency Department (HOSPITAL_COMMUNITY)
Admission: EM | Admit: 2023-02-15 | Discharge: 2023-02-15 | Disposition: A | Discharge status: Home / Self Care | Payer: Managed Care, Other (non HMO) | Attending: Emergency Medicine | Admitting: Emergency Medicine

## 2023-02-15 ENCOUNTER — Other Ambulatory Visit: Payer: Self-pay

## 2023-02-15 ENCOUNTER — Emergency Department (HOSPITAL_COMMUNITY): Payer: Managed Care, Other (non HMO)

## 2023-02-15 DIAGNOSIS — Z91040 Latex allergy status: Secondary | ICD-10-CM | POA: Diagnosis not present

## 2023-02-15 DIAGNOSIS — R0602 Shortness of breath: Secondary | ICD-10-CM | POA: Diagnosis present

## 2023-02-15 DIAGNOSIS — Z20822 Contact with and (suspected) exposure to covid-19: Secondary | ICD-10-CM | POA: Insufficient documentation

## 2023-02-15 DIAGNOSIS — Z7951 Long term (current) use of inhaled steroids: Secondary | ICD-10-CM | POA: Diagnosis not present

## 2023-02-15 DIAGNOSIS — J4521 Mild intermittent asthma with (acute) exacerbation: Secondary | ICD-10-CM

## 2023-02-15 LAB — RESP PANEL BY RT-PCR (RSV, FLU A&B, COVID)  RVPGX2
Influenza A by PCR: NEGATIVE
Influenza B by PCR: NEGATIVE
Resp Syncytial Virus by PCR: NEGATIVE
SARS Coronavirus 2 by RT PCR: NEGATIVE

## 2023-02-15 MED ORDER — PREDNISONE 20 MG PO TABS: 40.0000 mg | ORAL_TABLET | Freq: Every day | ORAL | 0 refills | Status: AC

## 2023-02-15 MED ORDER — ACETAMINOPHEN 325 MG PO TABS
650.0000 mg | ORAL_TABLET | Freq: Once | ORAL | Status: AC
  Administered 2023-02-15: 650 mg via ORAL
  Filled 2023-02-15: qty 2

## 2023-02-15 MED ORDER — ACETAMINOPHEN 325 MG PO TABS
ORAL_TABLET | ORAL | Status: AC
  Filled 2023-02-15: qty 1

## 2023-02-15 MED ORDER — METHYLPREDNISOLONE SODIUM SUCC 125 MG IJ SOLR
125.0000 mg | Freq: Once | INTRAMUSCULAR | Status: AC
  Administered 2023-02-15: 125 mg via INTRAVENOUS
  Filled 2023-02-15: qty 2

## 2023-02-15 MED ORDER — ALBUTEROL SULFATE HFA 108 (90 BASE) MCG/ACT IN AERS
2.0000 | INHALATION_SPRAY | RESPIRATORY_TRACT | Status: DC | PRN
  Administered 2023-02-15: 2 via RESPIRATORY_TRACT
  Filled 2023-02-15: qty 6.7

## 2023-02-15 MED ORDER — IPRATROPIUM-ALBUTEROL 0.5-2.5 (3) MG/3ML IN SOLN
3.0000 mL | Freq: Once | RESPIRATORY_TRACT | Status: AC
  Administered 2023-02-15: 3 mL via RESPIRATORY_TRACT
  Filled 2023-02-15: qty 3

## 2023-02-15 MED ORDER — ALBUTEROL SULFATE (2.5 MG/3ML) 0.083% IN NEBU
5.0000 mg | INHALATION_SOLUTION | Freq: Once | RESPIRATORY_TRACT | Status: AC
  Administered 2023-02-15: 5 mg via RESPIRATORY_TRACT
  Filled 2023-02-15: qty 6

## 2023-02-15 NOTE — ED Triage Notes (Signed)
Patient to ED by POV with c/o SOB. She was seen yesterday at [facility] and given a breathing treatment with no relief. She voices chest tightness and SOB.

## 2023-02-15 NOTE — ED Provider Notes (Signed)
[facility] EMERGENCY DEPARTMENT AT [facility] Provider Note   CSN: [id number] Arrival date & time: 02/15/23  4098     History  Chief Complaint  Patient presents with   Shortness of Breath    Tracy Lynn is a 38 y.o. female with past medical history of asthma presented to the emergency room with 1 day of shortness of breath.  Patient reports she has had some nasal congestion and cough for the last day.  Reports she seen urgent care yesterday given DuoNeb which improved her symptoms though she was at home.  Report because she continued to have some shortness of breath and wheezing she wanted to come here to get reevaluated.  Reports sick contact.  Reports she tried her rescue inhaler at home without improvement. Denies chest pain, abdominal pain, NVD.   Shortness of Breath      Home Medications Prior to Admission medications   Medication Sig Start Date End Date Taking? Authorizing Provider  albuterol (VENTOLIN HFA) 108 (90 Base) MCG/ACT inhaler Inhale 2 puffs into the lungs every 6 (six) hours as needed for wheezing or shortness of breath. 02/14/23   Wallis Bamberg, PA-C  amoxicillin-clavulanate (AUGMENTIN) 875-125 MG tablet Take 1 tablet by mouth 2 (two) times daily. 11/23/21   Wallis Bamberg, PA-C  cetirizine (ZYRTEC ALLERGY) 10 MG tablet Take 1 tablet (10 mg total) by mouth daily. 11/23/21   Wallis Bamberg, PA-C  ibuprofen (ADVIL) 800 MG tablet Take 1 tablet (800 mg total) by mouth 3 (three) times daily. 02/07/23   Rising, Lurena Joiner, PA-C  ipratropium-albuterol (DUONEB) 0.5-2.5 (3) MG/3ML SOLN Take 3 mLs by nebulization every 4 (four) hours as needed. 02/14/23   Wallis Bamberg, PA-C  levonorgestrel (MIRENA) 20 MCG/DAY IUD 1 each by Intrauterine route once.    [provider]  naproxen sodium (ALEVE) 220 MG tablet Take 440 mg by mouth daily as needed (pain).    [provider]  predniSONE (DELTASONE) 50 MG tablet Take 1 tablet (50 mg total) by mouth daily with breakfast.  06/12/22   Wallis Bamberg, PA-C  promethazine-dextromethorphan (PROMETHAZINE-DM) 6.25-15 MG/5ML syrup Take 5 mLs by mouth 3 (three) times daily as needed for cough. 06/12/22   Wallis Bamberg, PA-C  promethazine-dextromethorphan (PROMETHAZINE-DM) 6.25-15 MG/5ML syrup Take 5 mLs by mouth 3 (three) times daily as needed for cough. 02/14/23   Wallis Bamberg, PA-C  tobramycin-dexamethasone [facility]) ophthalmic solution Place 1 drop into both eyes every 4 (four) hours while awake. 03/29/22   Wallis Bamberg, PA-C      Allergies    Diphenhydramine hcl, Latex, and Penicillins    Review of Systems   Review of Systems  Respiratory:  Positive for shortness of breath.     Physical Exam Updated Vital Signs BP (!) 143/74 (BP Location: Left Arm)   Pulse (!) 109   Temp 99.3 F (37.4 C) (Oral)   Resp (!) 24   Ht 5\' 6"  (1.676 m)   Wt 129.3 kg   SpO2 96%   BMI 46.00 kg/m  Physical Exam Vitals and nursing note reviewed.  Constitutional:      General: She is not in acute distress.    Appearance: She is not toxic-appearing.  HENT:     Head: Normocephalic and atraumatic.     Nose: Congestion and rhinorrhea present.  Eyes:     General: No scleral icterus.    Conjunctiva/sclera: Conjunctivae normal.  Cardiovascular:     Rate and Rhythm: Normal rate and regular rhythm.  Pulses: Normal pulses.     Heart sounds: Normal heart sounds.  Pulmonary:     Effort: Pulmonary effort is normal. No respiratory distress.     Breath sounds: Normal breath sounds.  Abdominal:     General: Abdomen is flat. Bowel sounds are normal.     Palpations: Abdomen is soft.     Tenderness: There is no abdominal tenderness.  Skin:    General: Skin is warm and dry.     Findings: No lesion.  Neurological:     General: No focal deficit present.     Mental Status: She is alert and oriented to person, place, and time. Mental status is at baseline.     ED Results / Procedures / Treatments   Labs (all labs ordered are listed, but  only abnormal results are displayed) Labs Reviewed  RESP PANEL BY RT-PCR (RSV, FLU A&B, COVID)  [id number]    EKG EKG Interpretation Date/Time:  Wednesday February 15 2023 09:19:20 EST Ventricular Rate:  113 PR Interval:  152 QRS Duration:  58 QT Interval:  308 QTC Calculation: 422 R Axis:   81  Text Interpretation: Sinus tachycardia Septal infarct , age undetermined Abnormal ECG When compared with ECG of 10-Jan-2021 19:54, PREVIOUS ECG IS PRESENT No STEMI Confirmed by Alvester Chou [phone]) on 02/15/2023 12:25:00 PM  Radiology DG Chest 2 View Result Date: 02/15/2023 CLINICAL DATA:  Shortness of breath, history of asthma EXAM: CHEST - 2 VIEW COMPARISON:  06/12/2022 FINDINGS: The heart size and mediastinal contours are within normal limits. Both lungs are clear. The visualized skeletal structures are unremarkable. IMPRESSION: No active cardiopulmonary disease. Electronically Signed   By: Judie Petit.  Shick M.D.   On: 02/15/2023 09:48    Procedures Procedures    Medications Ordered in ED Medications  albuterol (VENTOLIN HFA) 108 (90 Base) MCG/ACT inhaler 2 puff (has no administration in time range)  methylPREDNISolone sodium succinate (SOLU-MEDROL) 125 mg/2 mL injection 125 mg (125 mg Intravenous Given 02/15/23 1400)  albuterol (PROVENTIL) (2.5 MG/3ML) 0.083% nebulizer solution 5 mg (5 mg Nebulization Given 02/15/23 1338)    ED Course/ Medical Decision Making/ A&P Clinical Course as of 02/15/23 1843  Wed Feb 15, 2023  1509 And is reassessed.  Wheezing has improved.  Patient reports her shortness of breath and wheezing has resolved.  She is feeling better and requesting to be discharged home. [JB]    Clinical Course User Index [JB] Yatziry Deakins, Horald Chestnut, PA-C                                 Medical Decision Making Amount and/or Complexity of Data Reviewed Radiology: ordered.  Risk OTC drugs. Prescription drug management.   [name] 38 y.o. presented today for URI like symptoms.  Working DDx that I considered at this time includes, but not limited to, viral illness, pharyngitis, mono, sinusitis, electrolyte abnormality, AOM.  R/o DDx: these additional diagnoses are not consistent with patient's history, presentation, physical exam, labs/imaging findings.  Review of prior external notes: 02/14/23 UC visit   Labs:  Respiratory Panel: negative    Imaging:  Chest x-ray without acute pathology   Problem List / ED Course / Critical interventions / Medication management  Reported to emergency room with complaint of shortness of breath, congestion for 1 day.  She tried albuterol inhaler at home without much improvement.  On arrival to emergency room she was slightly tachycardic with low-grade fever.  No  increased work of breathing.  She does have mild wheezing in all lung fields on auscultation she has had flulike symptoms for approximately 1 day however respiratory panel is negative.  Chest x-ray without any acute pathology, no pneumonia. After reevaluating patient from inhaler she reports she is feeling much better but still has some shortness of breath.  Will repeat DuoNeb and reassess closely.  Patient continues to not be hypoxic with normal work of breathing.  If symptoms resolve after second breathing treatment anticipate she will be stable for discharge home.  Will send her home with albuterol inhaler as well as short course of steroids and close follow-up with primary care. I ordered medication including steroids, albuterol, duoneb  Reevaluation of the patient after these medicines showed that the patient improved Patients vitals assessed. Upon arrival patient is hemodynamically stable.  I have reviewed the patients home medicines and have made adjustments as needed     Plan:  F/u w/ PCP in 2-3d to ensure resolution of sx.  Patient was given return precautions. Patient stable for discharge at this time.  Patient educated on sx and dx and verbalized understanding of  plan. Return to ER if new or worsening sx.          Final Clinical Impression(s) / ED Diagnoses Final diagnoses:  Mild intermittent asthma with exacerbation    Rx / DC Orders ED Discharge Orders     None         Smitty Knudsen, PA-C 02/15/23 1844    Terald Sleeper, MD 02/16/23 [phone]

## 2023-02-15 NOTE — Discharge Instructions (Addendum)
Follow-up with your primary care doctor.  I have sent steroids to your pharmacy please take as prescribed. Take albuterol inhaler as needed for wheezing and shortness of breath. Please return to emergency room if you have any new or worsening symptoms. If you develop congestion I recommend Claritin or Zyrtec.  If you develop muscle aches or fever please alternate Tylenol and ibuprofen.  You can take Tylenol up to 1000 mg a day.
